# Patient Record
Sex: Female | Born: 1985 | Hispanic: No | Marital: Single | Smoking: Current every day smoker
Health system: Southern US, Community
[De-identification: ages and names within clinical notes are randomized; demographics above are authoritative.]

---
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
## 2011-01-02 ENCOUNTER — Emergency Department (HOSPITAL_BASED_OUTPATIENT_CLINIC_OR_DEPARTMENT_OTHER)
Admission: EM | Admit: 2011-01-02 | Discharge: 2011-01-02 | Disposition: A | Discharge status: Home / Self Care | Payer: No Typology Code available for payment source | Attending: Emergency Medicine | Admitting: Emergency Medicine

## 2011-01-02 DIAGNOSIS — M549 Dorsalgia, unspecified: Secondary | ICD-10-CM | POA: Insufficient documentation

## 2011-01-02 DIAGNOSIS — F17200 Nicotine dependence, unspecified, uncomplicated: Secondary | ICD-10-CM | POA: Insufficient documentation

## 2011-01-02 DIAGNOSIS — Y92410 Unspecified street and highway as the place of occurrence of the external cause: Secondary | ICD-10-CM | POA: Insufficient documentation

## 2013-06-16 ENCOUNTER — Encounter (HOSPITAL_BASED_OUTPATIENT_CLINIC_OR_DEPARTMENT_OTHER): Payer: Self-pay | Admitting: Emergency Medicine

## 2013-06-16 ENCOUNTER — Emergency Department (HOSPITAL_BASED_OUTPATIENT_CLINIC_OR_DEPARTMENT_OTHER)
Admission: EM | Admit: 2013-06-16 | Discharge: 2013-06-17 | Disposition: A | Discharge status: Home / Self Care | Payer: BC Managed Care – PPO | Attending: Emergency Medicine | Admitting: Emergency Medicine

## 2013-06-16 DIAGNOSIS — N390 Urinary tract infection, site not specified: Secondary | ICD-10-CM | POA: Insufficient documentation

## 2013-06-16 DIAGNOSIS — Z3202 Encounter for pregnancy test, result negative: Secondary | ICD-10-CM | POA: Insufficient documentation

## 2013-06-16 DIAGNOSIS — N920 Excessive and frequent menstruation with regular cycle: Secondary | ICD-10-CM | POA: Insufficient documentation

## 2013-06-16 DIAGNOSIS — F17200 Nicotine dependence, unspecified, uncomplicated: Secondary | ICD-10-CM | POA: Insufficient documentation

## 2013-06-16 NOTE — ED Notes (Signed)
Pt c/o lower abd cramping and lower back pain with vaginal bleeding x 2 weeks

## 2013-06-17 LAB — URINE MICROSCOPIC-ADD ON

## 2013-06-17 LAB — URINALYSIS, ROUTINE W REFLEX MICROSCOPIC
Nitrite: NEGATIVE
Protein, ur: NEGATIVE mg/dL
Specific Gravity, Urine: 1.031 — ABNORMAL HIGH (ref 1.005–1.030)
pH: 5.5 (ref 5.0–8.0)

## 2013-06-17 LAB — WET PREP, GENITAL
Trich, Wet Prep: NONE SEEN
Yeast Wet Prep HPF POC: NONE SEEN

## 2013-06-17 LAB — PREGNANCY, URINE: Preg Test, Ur: NEGATIVE

## 2013-06-17 MED ORDER — IBUPROFEN 800 MG PO TABS: 800.0000 mg | ORAL_TABLET | Freq: Three times a day (TID) | ORAL | Status: DC

## 2013-06-17 MED ORDER — NITROFURANTOIN MONOHYD MACRO 100 MG PO CAPS: 100.0000 mg | ORAL_CAPSULE | Freq: Two times a day (BID) | ORAL | Status: DC

## 2013-06-17 NOTE — ED Provider Notes (Signed)
CSN: [id number]     Arrival date & time 06/16/13  2353 History   First MD Initiated Contact with Patient 06/17/13 0015     Chief Complaint  Patient presents with  . Vaginal Bleeding   (Consider location/radiation/quality/duration/timing/severity/associated sxs/prior Treatment) Patient is a 27 y.o. female presenting with vaginal bleeding. The history is provided by the patient.  Vaginal Bleeding Quality:  Lighter than menses and dark red Severity:  Mild Onset quality:  Gradual Duration:  5 days Timing:  Intermittent Progression:  Unchanged Chronicity:  New Menstrual history:  Irregular Possible pregnancy: no   Context: not after intercourse   Relieved by:  Nothing Worsened by:  Nothing tried Ineffective treatments:  None tried Associated symptoms: no fever   Risk factors: no STD     History reviewed. No pertinent past medical history. History reviewed. No pertinent past surgical history. History reviewed. No pertinent family history. History  Substance Use Topics  . Smoking status: Current Every Day Smoker    Types: Cigarettes  . Smokeless tobacco: Not on file  . Alcohol Use: No   OB History   Grav Para Term Preterm Abortions TAB SAB Ect Mult Living                 Review of Systems  Constitutional: Negative for fever.  Genitourinary: Positive for vaginal bleeding.  All other systems reviewed and are negative.    Allergies  Review of patient's allergies indicates no known allergies.  Home Medications  No current outpatient prescriptions on file. BP 137/82  Pulse 94  Temp(Src) 98.5 F (36.9 C) (Oral)  Resp 16  Ht 5\' 4"  (1.626 m)  Wt 270 lb (122.471 kg)  BMI 46.32 kg/m2  SpO2 95%  LMP 06/02/2013 Physical Exam  Constitutional: She is oriented to person, place, and time. She appears well-developed and well-nourished.  HENT:  Head: Normocephalic.  Mouth/Throat: Oropharynx is clear and moist.  Eyes: Conjunctivae are normal. Pupils are equal, round, and  reactive to light.  Neck: Normal range of motion. Neck supple.  Cardiovascular: Normal rate, regular rhythm and intact distal pulses.   Pulmonary/Chest: Effort normal and breath sounds normal. She has no wheezes. She has no rales.  Abdominal: Soft. Bowel sounds are normal. There is no tenderness. There is no rebound and no guarding.  Genitourinary: No vaginal discharge found.  Musculoskeletal: Normal range of motion.  Neurological: She is alert and oriented to person, place, and time.  Skin: Skin is warm and dry.  Psychiatric: She has a normal mood and affect.    ED Course  Procedures (including critical care time) Labs Review Labs Reviewed  WET PREP, GENITAL - Abnormal; Notable for the following:    WBC, Wet Prep HPF POC FEW (*)    All other components within normal limits  URINALYSIS, ROUTINE W REFLEX MICROSCOPIC - Abnormal; Notable for the following:    APPearance CLOUDY (*)    Specific Gravity, Urine 1.031 (*)    Hgb urine dipstick LARGE (*)    Bilirubin Urine SMALL (*)    Ketones, ur 15 (*)    Leukocytes, UA SMALL (*)    All other components within normal limits  URINE MICROSCOPIC-ADD ON - Abnormal; Notable for the following:    Squamous Epithelial / LPF MANY (*)    Bacteria, UA MANY (*)    All other components within normal limits  URINE CULTURE  GC/CHLAMYDIA PROBE AMP  PREGNANCY, URINE   Imaging Review No results found.  EKG Interpretation  None       MDM  No diagnosis found. Suspect anovulatory cycle follow up with GYN for ongoing care.  Will treat for uti and cramping with NSAID    Yardley Beltran K Dequan Kindred-Rasch, MD 06/17/13 [phone]

## 2013-06-18 LAB — URINE CULTURE

## 2013-06-19 LAB — GC/CHLAMYDIA PROBE AMP: CT Probe RNA: NEGATIVE

## 2014-03-11 ENCOUNTER — Encounter (HOSPITAL_COMMUNITY): Payer: Self-pay | Admitting: Emergency Medicine

## 2014-03-11 ENCOUNTER — Emergency Department (HOSPITAL_COMMUNITY)
Admission: EM | Admit: 2014-03-11 | Discharge: 2014-03-11 | Disposition: A | Discharge status: Home / Self Care | Payer: BC Managed Care – PPO | Attending: Emergency Medicine | Admitting: Emergency Medicine

## 2014-03-11 DIAGNOSIS — Y9389 Activity, other specified: Secondary | ICD-10-CM | POA: Diagnosis not present

## 2014-03-11 DIAGNOSIS — Y92410 Unspecified street and highway as the place of occurrence of the external cause: Secondary | ICD-10-CM | POA: Diagnosis not present

## 2014-03-11 DIAGNOSIS — S0993XA Unspecified injury of face, initial encounter: Secondary | ICD-10-CM | POA: Diagnosis not present

## 2014-03-11 DIAGNOSIS — F17200 Nicotine dependence, unspecified, uncomplicated: Secondary | ICD-10-CM | POA: Insufficient documentation

## 2014-03-11 DIAGNOSIS — Z041 Encounter for examination and observation following transport accident: Secondary | ICD-10-CM

## 2014-03-11 DIAGNOSIS — S199XXA Unspecified injury of neck, initial encounter: Principal | ICD-10-CM

## 2014-03-11 MED ORDER — NAPROXEN 500 MG PO TABS: 500.0000 mg | ORAL_TABLET | Freq: Once | ORAL | Status: DC

## 2014-03-11 MED ORDER — CYCLOBENZAPRINE HCL 10 MG PO TABS: 10.0000 mg | ORAL_TABLET | Freq: Two times a day (BID) | ORAL | Status: DC | PRN

## 2014-03-11 MED ORDER — NAPROXEN 500 MG PO TABS: 500.0000 mg | ORAL_TABLET | Freq: Two times a day (BID) | ORAL | Status: DC
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
## 2014-03-11 NOTE — ED Notes (Signed)
Patient states that she is having upper back pain with Headache and neck pain. The patient has c-collar intact.

## 2014-03-11 NOTE — ED Notes (Signed)
Pt left prior to receiving d/c instructions.
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
## 2014-03-12 NOTE — ED Provider Notes (Signed)
Medical screening examination/treatment/procedure(s) were performed by non-physician practitioner and as supervising physician I was immediately available for consultation/collaboration.   EKG Interpretation None       Jasper Riling. Alvino Chapel, MD 03/12/14 [phone]

## 2014-10-26 ENCOUNTER — Emergency Department (HOSPITAL_BASED_OUTPATIENT_CLINIC_OR_DEPARTMENT_OTHER)
Admission: EM | Admit: 2014-10-26 | Discharge: 2014-10-27 | Disposition: A | Discharge status: Home / Self Care | Payer: BLUE CROSS/BLUE SHIELD | Attending: Emergency Medicine | Admitting: Emergency Medicine

## 2014-10-26 ENCOUNTER — Encounter (HOSPITAL_BASED_OUTPATIENT_CLINIC_OR_DEPARTMENT_OTHER): Payer: Self-pay

## 2014-10-26 DIAGNOSIS — Z792 Long term (current) use of antibiotics: Secondary | ICD-10-CM | POA: Insufficient documentation

## 2014-10-26 DIAGNOSIS — K029 Dental caries, unspecified: Secondary | ICD-10-CM

## 2014-10-26 DIAGNOSIS — Z720 Tobacco use: Secondary | ICD-10-CM | POA: Insufficient documentation

## 2014-10-26 DIAGNOSIS — Z791 Long term (current) use of non-steroidal anti-inflammatories (NSAID): Secondary | ICD-10-CM | POA: Insufficient documentation

## 2014-10-26 NOTE — ED Notes (Signed)
MD at bedside. 

## 2014-10-26 NOTE — ED Notes (Signed)
Right upper toothache x today

## 2014-10-27 ENCOUNTER — Encounter (HOSPITAL_BASED_OUTPATIENT_CLINIC_OR_DEPARTMENT_OTHER): Payer: Self-pay | Admitting: Emergency Medicine

## 2014-10-27 MED ORDER — MELOXICAM 15 MG PO TABS
15.0000 mg | ORAL_TABLET | Freq: Every day | ORAL | Status: DC
Start: 2014-10-27 — End: 2015-01-31

## 2014-10-27 MED ORDER — PENICILLIN V POTASSIUM 250 MG PO TABS
500.0000 mg | ORAL_TABLET | Freq: Once | ORAL | Status: AC
  Administered 2014-10-27: 500 mg via ORAL

## 2014-10-27 MED ORDER — IBUPROFEN 800 MG PO TABS
ORAL_TABLET | ORAL | Status: AC
  Filled 2014-10-27: qty 1

## 2014-10-27 MED ORDER — PENICILLIN V POTASSIUM 250 MG PO TABS
ORAL_TABLET | ORAL | Status: AC
  Filled 2014-10-27: qty 2

## 2014-10-27 MED ORDER — IBUPROFEN 800 MG PO TABS
800.0000 mg | ORAL_TABLET | Freq: Once | ORAL | Status: AC
  Administered 2014-10-27: 800 mg via ORAL

## 2014-10-27 MED ORDER — PENICILLIN V POTASSIUM 500 MG PO TABS: 500.0000 mg | ORAL_TABLET | Freq: Four times a day (QID) | ORAL | Status: AC

## 2014-10-27 NOTE — ED Provider Notes (Signed)
CSN: [id number]     Arrival date & time 10/26/14  2222 History   First MD Initiated Contact with Patient 10/26/14 2343     Chief Complaint  Patient presents with  . Dental Pain     (Consider location/radiation/quality/duration/timing/severity/associated sxs/prior Treatment) Patient is a 29 y.o. female presenting with tooth pain. The history is provided by the patient.  Dental Pain Location:  Upper Upper teeth location:  2/RU 2nd molar Quality:  Dull Severity:  Severe Onset quality:  Sudden Timing:  Constant Progression:  Unchanged Chronicity:  New Context: dental caries   Previous work-up:  Dental exam Relieved by:  Nothing Worsened by:  Nothing tried Ineffective treatments:  None tried Associated symptoms: no congestion, no neck swelling and no trismus   Risk factors: smoking     History reviewed. No pertinent past medical history. History reviewed. No pertinent past surgical history. History reviewed. No pertinent family history. History  Substance Use Topics  . Smoking status: Current Every Day Smoker    Types: Cigarettes  . Smokeless tobacco: Not on file  . Alcohol Use: No   OB History    No data available     Review of Systems  HENT: Negative for congestion.   All other systems reviewed and are negative.     Allergies  Review of patient's allergies indicates no known allergies.  Home Medications   Prior to Admission medications   Medication Sig Start Date End Date Taking? Authorizing Provider  meloxicam (MOBIC) 15 MG tablet Take 1 tablet (15 mg total) by mouth daily. 10/27/14   Carin Shipp, MD  penicillin v potassium (VEETID) 500 MG tablet Take 1 tablet (500 mg total) by mouth 4 (four) times daily. 10/27/14 11/03/14  Krystyl Cannell, MD   BP 147/87 mmHg  Pulse 85  Temp(Src) 98.7 F (37.1 C) (Oral)  Resp 16  Ht 5\' 4"  (1.626 m)  Wt 260 lb (117.935 kg)  BMI 44.61 kg/m2  SpO2 100%  LMP 10/05/2014 Physical Exam  Constitutional: She is oriented to  person, place, and time. She appears well-developed and well-nourished. No distress.  HENT:  Head: Normocephalic and atraumatic.  Mouth/Throat: Oropharynx is clear and moist. No trismus in the jaw.    Eyes: Conjunctivae are normal. Pupils are equal, round, and reactive to light.  Neck: Normal range of motion. Neck supple.  Cardiovascular: Normal rate and regular rhythm.   Pulmonary/Chest: Effort normal. No respiratory distress. She has no wheezes. She has no rales.  Abdominal: Soft. Bowel sounds are normal. There is no tenderness. There is no rebound and no guarding.  Musculoskeletal: Normal range of motion.  Lymphadenopathy:    She has no cervical adenopathy.  Neurological: She is alert and oriented to person, place, and time.  Skin: Skin is warm and dry.  Psychiatric: She has a normal mood and affect.    ED Course  Procedures (including critical care time) Labs Review Labs Reviewed - No data to display  Imaging Review No results found.   EKG Interpretation None      MDM   Final diagnoses:  Dental caries    Penicillin, NSAIDs and close follow up with dentistry    Aleicia Kenagy, MD 10/27/14 [phone]

## 2014-10-27 NOTE — Discharge Instructions (Signed)
Dental Care and Dentist Visits  Dental care supports good overall health. Regular dental visits can also help you avoid dental pain, bleeding, infection, and other more serious health problems in the future. It is important to keep the mouth healthy because diseases in the teeth, gums, and other oral tissues can spread to other areas of the body. Some problems, such as diabetes, heart disease, and pre-term labor have been associated with poor oral health.   See your dentist every 6 months. If you experience emergency problems such as a toothache or broken tooth, go to the dentist right away. If you see your dentist regularly, you may catch problems early. It is easier to be treated for problems in the early stages.   WHAT TO EXPECT AT A DENTIST VISIT   Your dentist will look for many common oral health problems and recommend proper treatment. At your regular dental visit, you can expect:  · Gentle cleaning of the teeth and gums. This includes scraping and polishing. This helps to remove the sticky substance around the teeth and gums (plaque). Plaque forms in the mouth shortly after eating. Over time, plaque hardens on the teeth as tartar. If tartar is not removed regularly, it can cause problems. Cleaning also helps remove stains.  · Periodic X-rays. These pictures of the teeth and supporting bone will help your dentist assess the health of your teeth.  · Periodic fluoride treatments. Fluoride is a natural mineral shown to help strengthen teeth. Fluoride treatment involves applying a fluoride gel or varnish to the teeth. It is most commonly done in children.  · Examination of the mouth, tongue, jaws, teeth, and gums to look for any oral health problems, such as:  ¨ Cavities (dental caries). This is decay on the tooth caused by plaque, sugar, and acid in the mouth. It is best to catch a cavity when it is small.  ¨ Inflammation of the gums caused by plaque buildup (gingivitis).  ¨ Problems with the mouth or malformed  or misaligned teeth.  ¨ Oral cancer or other diseases of the soft tissues or jaws.   KEEP YOUR TEETH AND GUMS HEALTHY  For healthy teeth and gums, follow these general guidelines as well as your dentist's specific advice:  · Have your teeth professionally cleaned at the dentist every 6 months.  · Brush twice daily with a fluoride toothpaste.  · Floss your teeth daily.   · Ask your dentist if you need fluoride supplements, treatments, or fluoride toothpaste.  · Eat a healthy diet. Reduce foods and drinks with added sugar.  · Avoid smoking.  TREATMENT FOR ORAL HEALTH PROBLEMS  If you have oral health problems, treatment varies depending on the conditions present in your teeth and gums.  · Your caregiver will most likely recommend good oral hygiene at each visit.  · For cavities, gingivitis, or other oral health disease, your caregiver will perform a procedure to treat the problem. This is typically done at a separate appointment. Sometimes your caregiver will refer you to another dental specialist for specific tooth problems or for surgery.  SEEK IMMEDIATE DENTAL CARE IF:  · You have pain, bleeding, or soreness in the gum, tooth, jaw, or mouth area.  · A permanent tooth becomes loose or separated from the gum socket.  · You experience a blow or injury to the mouth or jaw area.  Document Released: 03/20/2011 Document Revised: 09/30/2011 Document Reviewed: 03/20/2011  ExitCare® Patient Information ©2015 [employer]. This information is not intended to replace advice   given to you by your health care provider. Make sure you discuss any questions you have with your health care provider.

## 2014-11-12 DIAGNOSIS — Z791 Long term (current) use of non-steroidal anti-inflammatories (NSAID): Secondary | ICD-10-CM | POA: Insufficient documentation

## 2014-11-12 DIAGNOSIS — Z720 Tobacco use: Secondary | ICD-10-CM | POA: Insufficient documentation

## 2014-11-12 DIAGNOSIS — N390 Urinary tract infection, site not specified: Secondary | ICD-10-CM | POA: Insufficient documentation

## 2014-11-13 ENCOUNTER — Encounter (HOSPITAL_BASED_OUTPATIENT_CLINIC_OR_DEPARTMENT_OTHER): Payer: Self-pay | Admitting: *Deleted

## 2014-11-13 ENCOUNTER — Emergency Department (HOSPITAL_BASED_OUTPATIENT_CLINIC_OR_DEPARTMENT_OTHER)
Admission: EM | Admit: 2014-11-13 | Discharge: 2014-11-13 | Disposition: A | Discharge status: Home / Self Care | Payer: BLUE CROSS/BLUE SHIELD | Attending: Emergency Medicine | Admitting: Emergency Medicine

## 2014-11-13 DIAGNOSIS — N390 Urinary tract infection, site not specified: Secondary | ICD-10-CM

## 2014-11-13 LAB — URINE MICROSCOPIC-ADD ON

## 2014-11-13 LAB — URINALYSIS, ROUTINE W REFLEX MICROSCOPIC
Bilirubin Urine: NEGATIVE
GLUCOSE, UA: NEGATIVE mg/dL
Ketones, ur: NEGATIVE mg/dL
LEUKOCYTES UA: NEGATIVE
Nitrite: POSITIVE — AB
Protein, ur: NEGATIVE mg/dL
SPECIFIC GRAVITY, URINE: 1.012 (ref 1.005–1.030)
Urobilinogen, UA: 1 mg/dL (ref 0.0–1.0)
pH: 6 (ref 5.0–8.0)

## 2014-11-13 MED ORDER — NITROFURANTOIN MONOHYD MACRO 100 MG PO CAPS: 100.0000 mg | ORAL_CAPSULE | Freq: Two times a day (BID) | ORAL | Status: DC

## 2014-11-13 MED ORDER — FLUCONAZOLE 150 MG PO TABS: 150.0000 mg | ORAL_TABLET | Freq: Once | ORAL | Status: DC

## 2014-11-13 MED ORDER — NITROFURANTOIN MONOHYD MACRO 100 MG PO CAPS
100.0000 mg | ORAL_CAPSULE | Freq: Once | ORAL | Status: AC
  Administered 2014-11-13: 100 mg via ORAL
  Filled 2014-11-13: qty 1

## 2014-11-13 NOTE — ED Notes (Signed)
Pt here for pain and burning with urination and today she saw that there was blood in her urine.  Pt denies any GYN symptoms with this.

## 2014-11-13 NOTE — Discharge Instructions (Signed)

## 2014-11-13 NOTE — ED Provider Notes (Signed)
CSN: [id number]     Arrival date & time 11/12/14  2352 History   First MD Initiated Contact with Patient 11/13/14 0146     Chief Complaint  Patient presents with  . Urinary Tract Infection     (Consider location/radiation/quality/duration/timing/severity/associated sxs/prior Treatment) HPI Patient presents with several days of burning urination, hematuria and suprapubic pain. Patient states she's had similar symptoms in the past related to urinary tract infection. She denies any vaginal bleeding or discharge. She recently finished a course of penicillin for dental pain has been taking over-the-counter AZO. She denies fever or chills. She has had no nausea or vomiting. No flank pain.   History reviewed. No pertinent past medical history. History reviewed. No pertinent past surgical history. No family history on file. History  Substance Use Topics  . Smoking status: Current Every Day Smoker    Types: Cigarettes  . Smokeless tobacco: Not on file  . Alcohol Use: No   OB History    No data available     Review of Systems  Constitutional: Negative for fever and chills.  Gastrointestinal: Positive for abdominal pain. Negative for nausea and vomiting.  Genitourinary: Positive for dysuria and hematuria. Negative for frequency, flank pain, vaginal bleeding, vaginal discharge, difficulty urinating, vaginal pain and pelvic pain.  Skin: Negative for rash and wound.  Neurological: Negative for weakness, light-headedness, numbness and headaches.  All other systems reviewed and are negative.     Allergies  Review of patient's allergies indicates no known allergies.  Home Medications   Prior to Admission medications   Medication Sig Start Date End Date Taking? Authorizing Provider  fluconazole (DIFLUCAN) 150 MG tablet Take 1 tablet (150 mg total) by mouth once. 11/13/14   Julianne Rice, MD  meloxicam (MOBIC) 15 MG tablet Take 1 tablet (15 mg total) by mouth daily. 10/27/14   April  Palumbo, MD  nitrofurantoin, macrocrystal-monohydrate, (MACROBID) 100 MG capsule Take 1 capsule (100 mg total) by mouth 2 (two) times daily. 11/13/14   Julianne Rice, MD   BP 145/84 mmHg  Pulse 72  Temp(Src) 98.1 F (36.7 C) (Oral)  Resp 18  Wt 260 lb (117.935 kg)  SpO2 99%  LMP 11/06/2014 Physical Exam  Constitutional: She is oriented to person, place, and time. She appears well-developed and well-nourished. No distress.  HENT:  Head: Normocephalic and atraumatic.  Mouth/Throat: Oropharynx is clear and moist.  Eyes: EOM are normal. Pupils are equal, round, and reactive to light.  Neck: Normal range of motion. Neck supple.  Cardiovascular: Normal rate and regular rhythm.   Pulmonary/Chest: Effort normal and breath sounds normal. No respiratory distress. She has no wheezes. She has no rales.  Abdominal: Soft. Bowel sounds are normal. She exhibits no distension and no mass. There is no tenderness. There is no rebound and no guarding.  Musculoskeletal: Normal range of motion. She exhibits no edema or tenderness.  No CVA tenderness bilaterally.  Neurological: She is alert and oriented to person, place, and time.  Skin: Skin is warm and dry. No rash noted. No erythema.  Psychiatric: She has a normal mood and affect. Her behavior is normal.  Nursing note and vitals reviewed.   ED Course  Procedures (including critical care time) Labs Review Labs Reviewed  URINALYSIS, ROUTINE W REFLEX MICROSCOPIC - Abnormal; Notable for the following:    Color, Urine ORANGE (*)    APPearance CLOUDY (*)    Hgb urine dipstick MODERATE (*)    Nitrite POSITIVE (*)    All other  components within normal limits  URINE MICROSCOPIC-ADD ON - Abnormal; Notable for the following:    Bacteria, UA FEW (*)    All other components within normal limits    Imaging Review No results found.   EKG Interpretation None      MDM   Final diagnoses:  UTI (lower urinary tract infection)    Patient with  nitrite positive urine. Only a few bacteria seen. Questionable partially treated UTI. No red blood cells seen. Discoloration of urine may be due to medication.  Short course of antibiotics. Return precautions have been given.    Julianne Rice, MD 11/13/14 [phone]

## 2014-12-21 ENCOUNTER — Emergency Department (HOSPITAL_BASED_OUTPATIENT_CLINIC_OR_DEPARTMENT_OTHER)
Admission: EM | Admit: 2014-12-21 | Discharge: 2014-12-21 | Disposition: A | Discharge status: Home / Self Care | Payer: BLUE CROSS/BLUE SHIELD | Attending: Emergency Medicine | Admitting: Emergency Medicine

## 2014-12-21 ENCOUNTER — Encounter (HOSPITAL_BASED_OUTPATIENT_CLINIC_OR_DEPARTMENT_OTHER): Payer: Self-pay | Admitting: Emergency Medicine

## 2014-12-21 ENCOUNTER — Emergency Department (HOSPITAL_BASED_OUTPATIENT_CLINIC_OR_DEPARTMENT_OTHER): Payer: BLUE CROSS/BLUE SHIELD

## 2014-12-21 ENCOUNTER — Emergency Department (HOSPITAL_BASED_OUTPATIENT_CLINIC_OR_DEPARTMENT_OTHER): Payer: No Typology Code available for payment source

## 2014-12-21 DIAGNOSIS — S80211A Abrasion, right knee, initial encounter: Secondary | ICD-10-CM | POA: Insufficient documentation

## 2014-12-21 DIAGNOSIS — M25561 Pain in right knee: Secondary | ICD-10-CM

## 2014-12-21 DIAGNOSIS — S90511A Abrasion, right ankle, initial encounter: Secondary | ICD-10-CM | POA: Insufficient documentation

## 2014-12-21 DIAGNOSIS — Z720 Tobacco use: Secondary | ICD-10-CM | POA: Insufficient documentation

## 2014-12-21 DIAGNOSIS — M25572 Pain in left ankle and joints of left foot: Secondary | ICD-10-CM

## 2014-12-21 DIAGNOSIS — M25571 Pain in right ankle and joints of right foot: Secondary | ICD-10-CM

## 2014-12-21 DIAGNOSIS — Y998 Other external cause status: Secondary | ICD-10-CM | POA: Insufficient documentation

## 2014-12-21 DIAGNOSIS — T148XXA Other injury of unspecified body region, initial encounter: Secondary | ICD-10-CM

## 2014-12-21 DIAGNOSIS — Z79899 Other long term (current) drug therapy: Secondary | ICD-10-CM | POA: Insufficient documentation

## 2014-12-21 DIAGNOSIS — S90512A Abrasion, left ankle, initial encounter: Secondary | ICD-10-CM | POA: Insufficient documentation

## 2014-12-21 DIAGNOSIS — Z23 Encounter for immunization: Secondary | ICD-10-CM | POA: Insufficient documentation

## 2014-12-21 DIAGNOSIS — Z791 Long term (current) use of non-steroidal anti-inflammatories (NSAID): Secondary | ICD-10-CM | POA: Insufficient documentation

## 2014-12-21 DIAGNOSIS — M25562 Pain in left knee: Secondary | ICD-10-CM

## 2014-12-21 DIAGNOSIS — S80212A Abrasion, left knee, initial encounter: Secondary | ICD-10-CM | POA: Insufficient documentation

## 2014-12-21 DIAGNOSIS — Y92410 Unspecified street and highway as the place of occurrence of the external cause: Secondary | ICD-10-CM | POA: Insufficient documentation

## 2014-12-21 DIAGNOSIS — M25579 Pain in unspecified ankle and joints of unspecified foot: Secondary | ICD-10-CM

## 2014-12-21 DIAGNOSIS — Y9389 Activity, other specified: Secondary | ICD-10-CM | POA: Insufficient documentation

## 2014-12-21 MED ORDER — HYDROCODONE-ACETAMINOPHEN 5-325 MG PO TABS: 1.0000 | ORAL_TABLET | ORAL | Status: DC | PRN

## 2014-12-21 MED ORDER — CYCLOBENZAPRINE HCL 5 MG PO TABS: 5.0000 mg | ORAL_TABLET | Freq: Two times a day (BID) | ORAL | Status: DC | PRN

## 2014-12-21 MED ORDER — TETANUS-DIPHTH-ACELL PERTUSSIS 5-2.5-18.5 LF-MCG/0.5 IM SUSP
0.5000 mL | Freq: Once | INTRAMUSCULAR | Status: AC
  Administered 2014-12-21: 0.5 mL via INTRAMUSCULAR
  Filled 2014-12-21: qty 0.5

## 2014-12-21 NOTE — ED Notes (Signed)
Warm blanket given to pt
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
## 2014-12-21 NOTE — Discharge Instructions (Signed)
Abrasion °An abrasion is a cut or scrape of the skin. Abrasions do not extend through all layers of the skin and most heal within 10 days. It is important to care for your abrasion properly to prevent infection. °CAUSES  °Most abrasions are caused by falling on, or gliding across, the ground or other surface. When your skin rubs on something, the outer and inner layer of skin rubs off, causing an abrasion. °DIAGNOSIS  °Your caregiver will be able to diagnose an abrasion during a physical exam.  °TREATMENT  °Your treatment depends on how large and deep the abrasion is. Generally, your abrasion will be cleaned with water and a mild soap to remove any dirt or debris. An antibiotic ointment may be put over the abrasion to prevent an infection. A bandage (dressing) may be wrapped around the abrasion to keep it from getting dirty.  °You may need a tetanus shot if: °· You cannot remember when you had your last tetanus shot. °· You have never had a tetanus shot. °· The injury broke your skin. °If you get a tetanus shot, your arm may swell, get red, and feel warm to the touch. This is common and not a problem. If you need a tetanus shot and you choose not to have one, there is a rare chance of getting tetanus. Sickness from tetanus can be serious.  °HOME CARE INSTRUCTIONS  °· If a dressing was applied, change it at least once a day or as directed by your caregiver. If the bandage sticks, soak it off with warm water.   °· Wash the area with water and a mild soap to remove all the ointment 2 times a day. Rinse off the soap and pat the area dry with a clean towel.   °· Reapply any ointment as directed by your caregiver. This will help prevent infection and keep the bandage from sticking. Use gauze over the wound and under the dressing to help keep the bandage from sticking.   °· Change your dressing right away if it becomes wet or dirty.   °· Only take over-the-counter or prescription medicines for pain, discomfort, or fever as  directed by your caregiver.   °· Follow up with your caregiver within 24-48 hours for a wound check, or as directed. If you were not given a wound-check appointment, look closely at your abrasion for redness, swelling, or pus. These are signs of infection. °SEEK IMMEDIATE MEDICAL CARE IF:  °· You have increasing pain in the wound.   °· You have redness, swelling, or tenderness around the wound.   °· You have pus coming from the wound.   °· You have a fever or persistent symptoms for more than 2-3 days. °· You have a fever and your symptoms suddenly get worse. °· You have a bad smell coming from the wound or dressing.   °MAKE SURE YOU:  °· Understand these instructions. °· Will watch your condition. °· Will get help right away if you are not doing well or get worse. °Document Released: 04/17/2005 Document Revised: 06/24/2012 Document Reviewed: 06/11/2011 °[employer]® Patient Information ©2015 [employer]. This information is not intended to replace advice given to you by your health care provider. Make sure you discuss any questions you have with your health care provider. °Motor Vehicle Collision °It is common to have multiple bruises and sore muscles after a motor vehicle collision (MVC). These tend to feel worse for the first 24 hours. You may have the most stiffness and soreness over the first several hours. You may also feel   worse when you wake up the first morning after your collision. After this point, you will usually begin to improve with each day. The speed of improvement often depends on the severity of the collision, the number of injuries, and the location and nature of these injuries. °HOME CARE INSTRUCTIONS °· Put ice on the injured area. °¨ Put ice in a plastic bag. °¨ Place a towel between your skin and the bag. °¨ Leave the ice on for 15-20 minutes, 3-4 times a day, or as directed by your health care provider. °· Drink enough fluids to keep your urine clear or pale yellow. Do not drink  alcohol. °· Take a warm shower or bath once or twice a day. This will increase blood flow to sore muscles. °· You may return to activities as directed by your caregiver. Be careful when lifting, as this may aggravate neck or back pain. °· Only take over-the-counter or prescription medicines for pain, discomfort, or fever as directed by your caregiver. Do not use aspirin. This may increase bruising and bleeding. °SEEK IMMEDIATE MEDICAL CARE IF: °· You have numbness, tingling, or weakness in the arms or legs. °· You develop severe headaches not relieved with medicine. °· You have severe neck pain, especially tenderness in the middle of the back of your neck. °· You have changes in bowel or bladder control. °· There is increasing pain in any area of the body. °· You have shortness of breath, light-headedness, dizziness, or fainting. °· You have chest pain. °· You feel sick to your stomach (nauseous), throw up (vomit), or sweat. °· You have increasing abdominal discomfort. °· There is blood in your urine, stool, or vomit. °· You have pain in your shoulder (shoulder strap areas). °· You feel your symptoms are getting worse. °MAKE SURE YOU: °· Understand these instructions. °· Will watch your condition. °· Will get help right away if you are not doing well or get worse. °Document Released: 07/08/2005 Document Revised: 11/22/2013 Document Reviewed: 12/05/2010 °Exit[employer]® Patient Information ©2015 [employer]. This information is not intended to replace advice given to you by your health care provider. Make sure you discuss any questions you have with your health care provider. ° °

## 2015-01-18 ENCOUNTER — Encounter (HOSPITAL_BASED_OUTPATIENT_CLINIC_OR_DEPARTMENT_OTHER): Payer: Self-pay | Admitting: *Deleted

## 2015-01-18 ENCOUNTER — Emergency Department (HOSPITAL_BASED_OUTPATIENT_CLINIC_OR_DEPARTMENT_OTHER)
Admission: EM | Admit: 2015-01-18 | Discharge: 2015-01-18 | Disposition: A | Discharge status: Home / Self Care | Payer: Self-pay | Attending: Emergency Medicine | Admitting: Emergency Medicine

## 2015-01-18 ENCOUNTER — Emergency Department (HOSPITAL_BASED_OUTPATIENT_CLINIC_OR_DEPARTMENT_OTHER): Payer: Self-pay

## 2015-01-18 DIAGNOSIS — W503XXA Accidental bite by another person, initial encounter: Secondary | ICD-10-CM

## 2015-01-18 DIAGNOSIS — S21212A Laceration without foreign body of left back wall of thorax without penetration into thoracic cavity, initial encounter: Secondary | ICD-10-CM | POA: Insufficient documentation

## 2015-01-18 DIAGNOSIS — Z791 Long term (current) use of non-steroidal anti-inflammatories (NSAID): Secondary | ICD-10-CM | POA: Insufficient documentation

## 2015-01-18 DIAGNOSIS — S21112A Laceration without foreign body of left front wall of thorax without penetration into thoracic cavity, initial encounter: Secondary | ICD-10-CM | POA: Insufficient documentation

## 2015-01-18 DIAGNOSIS — S41012A Laceration without foreign body of left shoulder, initial encounter: Secondary | ICD-10-CM | POA: Insufficient documentation

## 2015-01-18 DIAGNOSIS — Z720 Tobacco use: Secondary | ICD-10-CM | POA: Insufficient documentation

## 2015-01-18 DIAGNOSIS — Y998 Other external cause status: Secondary | ICD-10-CM | POA: Insufficient documentation

## 2015-01-18 DIAGNOSIS — Y9289 Other specified places as the place of occurrence of the external cause: Secondary | ICD-10-CM | POA: Insufficient documentation

## 2015-01-18 DIAGNOSIS — Z79899 Other long term (current) drug therapy: Secondary | ICD-10-CM | POA: Insufficient documentation

## 2015-01-18 DIAGNOSIS — Y9389 Activity, other specified: Secondary | ICD-10-CM | POA: Insufficient documentation

## 2015-01-18 MED ORDER — AMOXICILLIN-POT CLAVULANATE 875-125 MG PO TABS: 1.0000 | ORAL_TABLET | Freq: Two times a day (BID) | ORAL | Status: DC

## 2015-01-18 MED ORDER — AMOXICILLIN-POT CLAVULANATE 875-125 MG PO TABS
1.0000 | ORAL_TABLET | Freq: Once | ORAL | Status: AC
  Administered 2015-01-18: 1 via ORAL
  Filled 2015-01-18: qty 1

## 2015-01-18 MED ORDER — FLUCONAZOLE 150 MG PO TABS: 150.0000 mg | ORAL_TABLET | Freq: Once | ORAL | Status: DC

## 2015-01-18 MED ORDER — LIDOCAINE-EPINEPHRINE 2 %-1:100000 IJ SOLN
20.0000 mL | Freq: Once | INTRAMUSCULAR | Status: AC
  Administered 2015-01-18: 20 mL via INTRADERMAL
  Filled 2015-01-18: qty 1

## 2015-01-18 MED ORDER — IBUPROFEN 800 MG PO TABS: 800.0000 mg | ORAL_TABLET | Freq: Three times a day (TID) | ORAL | Status: DC | PRN
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
## 2015-01-31 ENCOUNTER — Encounter (HOSPITAL_BASED_OUTPATIENT_CLINIC_OR_DEPARTMENT_OTHER): Payer: Self-pay

## 2015-01-31 ENCOUNTER — Emergency Department (HOSPITAL_BASED_OUTPATIENT_CLINIC_OR_DEPARTMENT_OTHER)
Admission: EM | Admit: 2015-01-31 | Discharge: 2015-01-31 | Disposition: A | Discharge status: Home / Self Care | Payer: Self-pay | Attending: Emergency Medicine | Admitting: Emergency Medicine

## 2015-01-31 DIAGNOSIS — Z4802 Encounter for removal of sutures: Secondary | ICD-10-CM | POA: Insufficient documentation

## 2015-01-31 DIAGNOSIS — Z720 Tobacco use: Secondary | ICD-10-CM | POA: Insufficient documentation

## 2015-01-31 NOTE — Discharge Instructions (Signed)

## 2015-01-31 NOTE — ED Provider Notes (Signed)
CSN: [id number]     Arrival date & time 01/31/15  1503 History   First MD Initiated Contact with Patient 01/31/15 1534     Chief Complaint  Patient presents with  . Follow-up     (Consider location/radiation/quality/duration/timing/severity/associated sxs/prior Treatment) HPI Karen Murray is a 29 y.o. female patient presents today for suture removal. Denies any fevers, chills, nausea or vomiting, abdominal pain, chest pain, shortness of breath. She does report intermittent left sided arm pain, but does not want any new medications or treatment for this. She states "I'll be fine with ibuprofen at home". She does report intermittent numbness in her left chest. She denies any decreased range of motion, cool extremities. No other modifying factors. No other medical complaints at this time.  History reviewed. No pertinent past medical history. History reviewed. No pertinent past surgical history. No family history on file. History  Substance Use Topics  . Smoking status: Current Every Day Smoker    Types: Cigarettes  . Smokeless tobacco: Not on file  . Alcohol Use: Yes   OB History    No data available     Review of Systems A 10 point review of systems was completed and was negative except for pertinent positives and negatives as mentioned in the history of present illness     Allergies  Review of patient's allergies indicates no known allergies.  Home Medications   Prior to Admission medications   Not on File   BP 145/87 mmHg  Pulse 90  Temp(Src) 98.2 F (36.8 C) (Oral)  Resp 18  Ht 5\' 4"  (1.626 m)  Wt 250 lb (113.399 kg)  BMI 42.89 kg/m2  SpO2 100%  LMP 01/18/2015 Physical Exam  Constitutional:  Awake, alert, nontoxic appearance.  HENT:  Head: Atraumatic.  Eyes: Right eye exhibits no discharge. Left eye exhibits no discharge.  Neck: Neck supple.  Pulmonary/Chest: Effort normal. She exhibits no tenderness.  Abdominal: Soft. There is no tenderness. There is no  rebound.  Musculoskeletal: She exhibits no tenderness.  Baseline ROM, no obvious new focal weakness.  Neurological:  Mental status and motor strength appears baseline for patient and situation. Sensation intact to light touch throughout chest and extremities.. No focal neurodeficits.  Skin: No rash noted.  Sutures to left lateral neck and left posterior shoulder healing well. No evidence of cellulitis or other infection.  Psychiatric: She has a normal mood and affect.  Nursing note and vitals reviewed.   ED Course  SUTURE REMOVAL Date/Time: 01/31/2015 4:19 PM Performed by: Comer Locket Authorized by: Comer Locket Consent: Verbal consent obtained. Risks and benefits: risks, benefits and alternatives were discussed Consent given by: patient Patient understanding: patient states understanding of the procedure being performed Patient identity confirmed: verbally with patient Body area: head/neck Location details: neck Wound Appearance: clean Sutures Removed: 3 Patient tolerance: Patient tolerated the procedure well with no immediate complications   SUTURE REMOVAL Date/Time: 01/31/2015 4:20 PM Performed by: Comer Locket Authorized by: Comer Locket Consent: Verbal consent obtained. Risks and benefits: risks, benefits and alternatives were discussed Consent given by: patient Patient understanding: patient states understanding of the procedure being performed Patient identity confirmed: verbally with patient Body area: Left shoulder Location details: Left shoulder  Wound Appearance: clean Sutures Removed: 3 Patient tolerance: Patient tolerated the procedure well with no immediate complications    (including critical care time)  Labs Review Labs Reviewed - No data to display  Imaging Review No results found.   EKG Interpretation None  Meds given in ED:  Medications - No data to display  There are no discharge medications for this  patient.  Filed Vitals:   01/31/15 1510 01/31/15 1638  BP: 115/57 145/87  Pulse: 111 90  Temp: 98.2 F (36.8 C)   TempSrc: Oral   Resp: 18 18  Height: 5\' 4"  (1.626 m)   Weight: 250 lb (113.399 kg)   SpO2: 100% 100%    MDM  Vitals stable - WNL -afebrile Pt resting comfortably in ED. PE--physical exam is not concerning. Normal neuro exam. Moves all 4 extremities with normal range of motion and without discomfort. Ambulates without difficulty. DDX--total of 6 sutures removed in the ED. No evidence of cellulitis or infection. Encourage continued use of ibuprofen for discomfort at home. Follow-up with primary care. No evidence of other acute or emergent pathology. I discussed all relevant lab findings and imaging results with pt and they verbalized understanding. Discussed f/u with PCP within 48 hrs and return precautions, pt very amenable to plan.  Final diagnoses:  Visit for suture removal        Comer Locket, PA-C 01/31/15 1734  Veryl Speak, MD 01/31/15 1754
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
## 2015-01-31 NOTE — ED Notes (Addendum)
Report received from [place], [place] care assumed.
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
## 2020-07-16 ENCOUNTER — Other Ambulatory Visit: Payer: Self-pay

## 2020-07-16 ENCOUNTER — Encounter (HOSPITAL_BASED_OUTPATIENT_CLINIC_OR_DEPARTMENT_OTHER): Payer: Self-pay

## 2020-07-16 ENCOUNTER — Emergency Department (HOSPITAL_BASED_OUTPATIENT_CLINIC_OR_DEPARTMENT_OTHER)
Admission: EM | Admit: 2020-07-16 | Discharge: 2020-07-16 | Disposition: A | Discharge status: Home / Self Care | Payer: Managed Care, Other (non HMO) | Attending: Emergency Medicine | Admitting: Emergency Medicine

## 2020-07-16 DIAGNOSIS — U071 COVID-19: Secondary | ICD-10-CM | POA: Insufficient documentation

## 2020-07-16 DIAGNOSIS — R69 Illness, unspecified: Secondary | ICD-10-CM | POA: Diagnosis present

## 2020-07-16 DIAGNOSIS — F17210 Nicotine dependence, cigarettes, uncomplicated: Secondary | ICD-10-CM | POA: Insufficient documentation

## 2020-07-16 LAB — RESP PANEL BY RT-PCR (FLU A&B, COVID) ARPGX2
Influenza A by PCR: NEGATIVE
Influenza B by PCR: NEGATIVE
SARS Coronavirus 2 by RT PCR: POSITIVE — AB

## 2020-07-16 MED ORDER — BENZONATATE 100 MG PO CAPS: 100.0000 mg | ORAL_CAPSULE | Freq: Three times a day (TID) | ORAL | 0 refills | Status: AC

## 2020-07-16 MED ORDER — ONDANSETRON 4 MG PO TBDP: ORAL_TABLET | ORAL | 0 refills | Status: AC

## 2020-07-16 NOTE — Discharge Instructions (Signed)
Your Covid test today was positive.  Please contact your work and let them know.  Each workplace has a different protocol and they may have different timing of when they like you to return to work.  If you develop symptoms this may delay when you are able to return.  Take tylenol 2 pills 4 times a day and motrin 4 pills 3 times a day.  Drink plenty of fluids.  Return for worsening shortness of breath, headache, confusion. Follow up with your family doctor.

## 2020-07-16 NOTE — ED Provider Notes (Signed)
[facility] EMERGENCY DEPARTMENT Provider Note   CSN: [id number] Arrival date & time: 07/16/20  1451     History Chief Complaint  Patient presents with  . Exposure to [name] is a 34 y.o. female.  34 yo F with a cc of being exposed to someone who is suspected to have the novel coronavirus.  Patient denies any symptoms.  Has been in close contact with this person for the past 48 hours.  She denies cough or congestion denies nausea vomiting or diarrhea.  Denies abdominal pain.  The history is provided by the patient.  Illness Severity:  Moderate Onset quality:  Gradual Duration:  1 day Timing:  Constant Progression:  Unchanged Chronicity:  New Associated symptoms: no chest pain, no congestion, no fever, no headaches, no myalgias, no nausea, no rhinorrhea, no shortness of breath, no vomiting and no wheezing        History reviewed. No pertinent past medical history.  There are no problems to display for this patient.   History reviewed. No pertinent surgical history.   OB History   No obstetric history on file.     History reviewed. No pertinent family history.  Social History   Tobacco Use  . Smoking status: Current Every Day Smoker    Types: Cigarettes  Substance Use Topics  . Alcohol use: Yes  . Drug use: Yes    Types: Marijuana    Home Medications Prior to Admission medications   Medication Sig Start Date End Date Taking? Authorizing Provider  benzonatate (TESSALON) 100 MG capsule Take 1 capsule (100 mg total) by mouth every 8 (eight) hours. 07/16/20   Deno Etienne, DO  ondansetron (ZOFRAN ODT) 4 MG disintegrating tablet 4mg  ODT q4 hours prn nausea/vomit 07/16/20   Deno Etienne, DO    Allergies    Patient has no known allergies.  Review of Systems   Review of Systems  Constitutional: Negative for chills and fever.  HENT: Negative for congestion and rhinorrhea.   Eyes: Negative for redness and visual disturbance.   Respiratory: Negative for shortness of breath and wheezing.   Cardiovascular: Negative for chest pain and palpitations.  Gastrointestinal: Negative for nausea and vomiting.  Genitourinary: Negative for dysuria and urgency.  Musculoskeletal: Negative for arthralgias and myalgias.  Skin: Negative for pallor and wound.  Neurological: Negative for dizziness and headaches.    Physical Exam Updated Vital Signs BP (!) 164/86 (BP Location: Right Arm)   Pulse 88   Temp 98.2 F (36.8 C) (Oral)   Resp 18   Ht 5\' 4"  (1.626 m)   Wt 127 kg   SpO2 100%   BMI 48.06 kg/m   Physical Exam Vitals and nursing note reviewed.  Constitutional:      General: She is not in acute distress.    Appearance: She is well-developed and well-nourished. She is obese. She is not diaphoretic.  HENT:     Head: Normocephalic and atraumatic.  Eyes:     Extraocular Movements: EOM normal.     Pupils: Pupils are equal, round, and reactive to light.  Cardiovascular:     Rate and Rhythm: Normal rate and regular rhythm.     Heart sounds: No murmur heard. No friction rub. No gallop.   Pulmonary:     Effort: Pulmonary effort is normal.     Breath sounds: No wheezing or rales.  Abdominal:     General: There is no distension.     Palpations: Abdomen is  soft.     Tenderness: There is no abdominal tenderness.  Musculoskeletal:        General: No tenderness or edema.     Cervical back: Normal range of motion and neck supple.  Skin:    General: Skin is warm and dry.  Neurological:     Mental Status: She is alert and oriented to person, place, and time.  Psychiatric:        Mood and Affect: Mood and affect normal.        Behavior: Behavior normal.     ED Results / Procedures / Treatments   Labs (all labs ordered are listed, but only abnormal results are displayed) Labs Reviewed  RESP PANEL BY RT-PCR (FLU A&B, COVID) ARPGX2 - Abnormal; Notable for the following components:      Result Value   SARS  Coronavirus 2 by RT PCR POSITIVE (*)    All other components within normal limits    EKG None  Radiology No results found.  Procedures Procedures (including critical care time)  Medications Ordered in ED Medications - No data to display  ED Course  I have reviewed the triage vital signs and the nursing notes.  Pertinent labs & imaging results that were available during my care of the patient were reviewed by me and considered in my medical decision making (see chart for details).    MDM Rules/Calculators/A&P                          34 yo F with a chief complaints of a close exposure to the novel coronavirus.  Her Covid test here is positive.  She is well-appearing and nontoxic.  100% on room air.  No tachypnea.  Asymptomatic.  We will have her self isolate at home.  Prescribed cough and nausea medicine if needed.  Given Covid clinic follow-up information.  Karen Murray was evaluated in Emergency Department on 07/16/2020 for the symptoms described in the history of present illness. He/she was evaluated in the context of the global COVID-19 pandemic, which necessitated consideration that the patient might be at risk for infection with the SARS-CoV-2 virus that causes COVID-19. Institutional protocols and algorithms that pertain to the evaluation of patients at risk for COVID-19 are in a state of rapid change based on information released by regulatory bodies including the CDC and federal and state organizations. These policies and algorithms were followed during the patient's care in the ED.   6:55 PM:  I have discussed the diagnosis/risks/treatment options with the patient and believe the pt to be eligible for discharge home to follow-up with PCP, Covid clinic. We also discussed returning to the ED immediately if new or worsening sx occur. We discussed the sx which are most concerning (e.g., sudden worsening pain, fever, inability to tolerate by mouth) that necessitate immediate  return. Medications administered to the patient during their visit and any new prescriptions provided to the patient are listed below.  Medications given during this visit Medications - No data to display   The patient appears reasonably screen and/or stabilized for discharge and I doubt any other medical condition or other [facility] requiring further screening, evaluation, or treatment in the ED at this time prior to discharge.   Final Clinical Impression(s) / ED Diagnoses Final diagnoses:  COVID-19 virus infection    Rx / DC Orders ED Discharge Orders         Ordered    ondansetron (ZOFRAN ODT) 4  MG disintegrating tablet        07/16/20 1803    benzonatate (TESSALON) 100 MG capsule  Every 8 hours        07/16/20 1803           Deno Etienne, DO 07/16/20 1855

## 2020-07-16 NOTE — ED Triage Notes (Signed)
Pt states she wants to be tested for covid, exposed to sister and friend down the hall. Denies symptoms

## 2022-04-08 ENCOUNTER — Emergency Department (HOSPITAL_BASED_OUTPATIENT_CLINIC_OR_DEPARTMENT_OTHER)
Admission: EM | Admit: 2022-04-08 | Discharge: 2022-04-08 | Disposition: A | Discharge status: Home / Self Care | Payer: Self-pay | Attending: Emergency Medicine | Admitting: Emergency Medicine

## 2022-04-08 ENCOUNTER — Emergency Department (HOSPITAL_BASED_OUTPATIENT_CLINIC_OR_DEPARTMENT_OTHER): Payer: Self-pay

## 2022-04-08 ENCOUNTER — Other Ambulatory Visit: Payer: Self-pay

## 2022-04-08 ENCOUNTER — Encounter (HOSPITAL_BASED_OUTPATIENT_CLINIC_OR_DEPARTMENT_OTHER): Payer: Self-pay | Admitting: Emergency Medicine

## 2022-04-08 DIAGNOSIS — D259 Leiomyoma of uterus, unspecified: Secondary | ICD-10-CM | POA: Insufficient documentation

## 2022-04-08 LAB — URINALYSIS, ROUTINE W REFLEX MICROSCOPIC
Bilirubin Urine: NEGATIVE
Glucose, UA: NEGATIVE mg/dL
Ketones, ur: NEGATIVE mg/dL
Leukocytes,Ua: NEGATIVE
Nitrite: NEGATIVE
Protein, ur: NEGATIVE mg/dL
Specific Gravity, Urine: >=1.03 (ref 1.005–1.030)
pH: 5.5 (ref 5.0–8.0)

## 2022-04-08 LAB — CBC
HCT: 32.9 % — ABNORMAL LOW (ref 36.0–46.0)
Hemoglobin: 10.2 g/dL — ABNORMAL LOW (ref 12.0–15.0)
MCH: 26.2 pg (ref 26.0–34.0)
MCHC: 31 g/dL (ref 30.0–36.0)
MCV: 84.6 fL (ref 80.0–100.0)
Platelets: 362 10*3/uL (ref 150–400)
RBC: 3.89 MIL/uL (ref 3.87–5.11)
RDW: 16 % — ABNORMAL HIGH (ref 11.5–15.5)
WBC: 8 10*3/uL (ref 4.0–10.5)
nRBC: 0 % (ref 0.0–0.2)

## 2022-04-08 LAB — URINALYSIS, MICROSCOPIC (REFLEX)

## 2022-04-08 LAB — WET PREP, GENITAL
Clue Cells Wet Prep HPF POC: NONE SEEN
Sperm: NONE SEEN
Trich, Wet Prep: NONE SEEN
WBC, Wet Prep HPF POC: <10 (ref <10)
Yeast Wet Prep HPF POC: NONE SEEN

## 2022-04-08 LAB — COMPREHENSIVE METABOLIC PANEL
ALT: 14 U/L (ref 0–44)
AST: 18 U/L (ref 15–41)
Albumin: 3.3 g/dL — ABNORMAL LOW (ref 3.5–5.0)
Alkaline Phosphatase: 75 U/L (ref 38–126)
Anion gap: 4 — ABNORMAL LOW (ref 5–15)
BUN: 11 mg/dL (ref 6–20)
CO2: 25 mmol/L (ref 22–32)
Calcium: 8.5 mg/dL — ABNORMAL LOW (ref 8.9–10.3)
Chloride: 108 mmol/L (ref 98–111)
Creatinine, Ser: 0.69 mg/dL (ref 0.44–1.00)
GFR, Estimated: >60 mL/min (ref >60)
Glucose, Bld: 63 mg/dL — ABNORMAL LOW (ref 70–99)
Potassium: 3.9 mmol/L (ref 3.5–5.1)
Sodium: 137 mmol/L (ref 135–145)
Total Bilirubin: 0.3 mg/dL (ref 0.3–1.2)
Total Protein: 6.4 g/dL — ABNORMAL LOW (ref 6.5–8.1)

## 2022-04-08 LAB — CBG MONITORING, ED: Glucose-Capillary: 110 mg/dL — ABNORMAL HIGH (ref 70–99)

## 2022-04-08 LAB — PREGNANCY, URINE: Preg Test, Ur: NEGATIVE

## 2022-04-08 MED ORDER — KETOROLAC TROMETHAMINE 60 MG/2ML IM SOLN
60.0000 mg | Freq: Once | INTRAMUSCULAR | Status: AC
  Administered 2022-04-08: 60 mg via INTRAMUSCULAR
  Filled 2022-04-08: qty 2

## 2022-04-08 NOTE — ED Triage Notes (Signed)
Patient presents to ED via POV from home. Here with abdominal pain and spotting x 1 week.

## 2022-04-08 NOTE — Discharge Instructions (Addendum)
You were evaluated in the Emergency Department and after careful evaluation, we did not find any emergent condition requiring admission or further testing in the hospital.  Your work-up today showed that you have a uterine fibroid.  Please make sure to follow-up with [facility] [place], I provided their phone number in your discharge paperwork.  Please call them and schedule an appointment.  You may take Tylenol or ibuprofen for pain.  Please return to the Emergency Department if you experience any worsening of your condition.   Thank you for allowing [place] to be a part of your care.

## 2022-04-08 NOTE — ED Provider Notes (Signed)
[facility] EMERGENCY DEPARTMENT Provider Note   CSN: [id number] Arrival date & time: 04/08/22  1103     History  Chief Complaint  Patient presents with   Abdominal Pain    Karen Murray is a 36 y.o. female.  HPI 36 year old female with no significant medical history presents to the ER with complaints of pain in her right lower quadrant.  Patient states that she has been having on and off pain, that is worse at night since Wednesday of last week.  She also has noted some spotting with bright red blood.  She woke up this morning with more bleeding, and decided to come be evaluated in the ER.  She denies any nausea or vomiting.  She is sexually active with women and performs mainly oral sex none penetrative.  She is not on any birth control.    Home Medications Prior to Admission medications   Medication Sig Start Date End Date Taking? Authorizing Provider  benzonatate (TESSALON) 100 MG capsule Take 1 capsule (100 mg total) by mouth every 8 (eight) hours. 07/16/20   Deno Etienne, DO  ondansetron (ZOFRAN ODT) 4 MG disintegrating tablet '4mg'$  ODT q4 hours prn nausea/vomit 07/16/20   Deno Etienne, DO      Allergies    Patient has no known allergies.    Review of Systems   Review of Systems Ten systems reviewed and are negative for acute change, except as noted in the HPI.   Physical Exam Updated Vital Signs BP 136/80   Pulse 84   Temp 98.9 F (37.2 C)   Resp 15   SpO2 100%  Physical Exam Vitals and nursing note reviewed.  Constitutional:      General: She is not in acute distress.    Appearance: She is well-developed.  HENT:     Head: Normocephalic and atraumatic.  Eyes:     Conjunctiva/sclera: Conjunctivae normal.  Cardiovascular:     Rate and Rhythm: Normal rate and regular rhythm.     Heart sounds: No murmur heard. Pulmonary:     Effort: Pulmonary effort is normal. No respiratory distress.     Breath sounds: Normal breath sounds.  Abdominal:      Palpations: Abdomen is soft.     Tenderness: There is abdominal tenderness in the right lower quadrant.  Genitourinary:    Comments: Pelvic exam performed with RN at bedside.  Mild right-sided adnexal tenderness, no cervical motion tenderness, no left-sided adnexal tenderness.  No significant vaginal bleeding or discharge. Musculoskeletal:        General: No swelling.     Cervical back: Neck supple.  Skin:    General: Skin is warm and dry.     Capillary Refill: Capillary refill takes less than 2 seconds.  Neurological:     Mental Status: She is alert.  Psychiatric:        Mood and Affect: Mood normal.     ED Results / Procedures / Treatments   Labs (all labs ordered are listed, but only abnormal results are displayed) Labs Reviewed  CBC - Abnormal; Notable for the following components:      Result Value   Hemoglobin 10.2 (*)    HCT 32.9 (*)    RDW 16.0 (*)    All other components within normal limits  COMPREHENSIVE METABOLIC PANEL - Abnormal; Notable for the following components:   Glucose, Bld 63 (*)    Calcium 8.5 (*)    Total Protein 6.4 (*)    Albumin 3.3 (*)  Anion gap 4 (*)    All other components within normal limits  URINALYSIS, ROUTINE W REFLEX MICROSCOPIC - Abnormal; Notable for the following components:   Hgb urine dipstick TRACE (*)    All other components within normal limits  URINALYSIS, MICROSCOPIC (REFLEX) - Abnormal; Notable for the following components:   Bacteria, UA RARE (*)    All other components within normal limits  CBG MONITORING, ED - Abnormal; Notable for the following components:   Glucose-Capillary 110 (*)    All other components within normal limits  WET PREP, GENITAL  PREGNANCY, URINE  GC/CHLAMYDIA PROBE AMP ([place]) NOT AT [facility]    EKG None  Radiology US PELVIC COMPLETE W TRANSVAGINAL AND TORSION R/O  Result Date: 04/08/2022 CLINICAL DATA:  Pelvic pain EXAM: TRANSABDOMINAL AND TRANSVAGINAL ULTRASOUND OF PELVIS DOPPLER  ULTRASOUND OF OVARIES TECHNIQUE: Both transabdominal and transvaginal ultrasound examinations of the pelvis were performed. Transabdominal technique was performed for global imaging of the pelvis including uterus, ovaries, adnexal regions, and pelvic cul-de-sac. It was necessary to proceed with endovaginal exam following the transabdominal exam to visualize the uterus, endometrium, ovaries, and adnexa. Color and duplex Doppler ultrasound was utilized to evaluate blood flow to the ovaries. COMPARISON:  None Available. FINDINGS: Uterus Measurements: 9.7 x 8.8 x 8.0 cm = volume: 355 mL. Uterus is anteverted. A large fibroid is noted, measuring 8.0 x 6.4 x 6.5 cm. Endometrium Thickness: 4 mm. No focal abnormality visualized, although visualization is somewhat limited by uterine fibroid. Right ovary Measurements: 4.7 x 2.6 x 3.2 cm = volume: 20.6 mL. The right ovary is not well visualized transvaginally due to sound attenuation. Left ovary Measurements: 2.3 x 2.2 x 2.1 cm = volume: 5.6 mL. The left ovary is not seen transvaginally. Pulsed Doppler evaluation of both ovaries could only be evaluated transabdominally and demonstrates normal low-resistance arterial and venous waveforms. Other findings No abnormal free fluid. IMPRESSION: Evaluation is somewhat limited by sound attenuation due to a large uterine fibroid. Within this limitation, no additional acute process in the pelvis. Electronically Signed   By: Merilyn Baba M.D.   On: 04/08/2022 14:48    Procedures Procedures    Medications Ordered in ED Medications  ketorolac (TORADOL) injection 60 mg (60 mg Intramuscular Given 04/08/22 1327)    ED Course/ Medical Decision Making/ A&P Clinical Course as of 04/08/22 1514  Mon Apr 08, 2022  1459 US PELVIC COMPLETE W TRANSVAGINAL AND TORSION R/O IMPRESSION: Evaluation is somewhat limited by sound attenuation due to a large uterine fibroid. Within this limitation, no additional acute process in the pelvis.    [MB]    Clinical Course User Index [MB] Garald Balding, PA-C                           Medical Decision Making Amount and/or Complexity of Data Reviewed Labs: ordered. Radiology: ordered. Decision-making details documented in ED Course.  Risk Prescription drug management.  36 year old female presenting with occasional right lower quadrant pain and spotting.  Vitals overall reassuring.  Pelvic exam with mild adnexal tenderness and right lower quadrant tenderness, no peritoneal signs, no guarding.  No cervical motion tenderness.  Differential diagnosis includes variant torsion, TOA, fibroids, appendicitis, UTI, constipation  Labs ordered, reviewed and interpreted by me.  CBC with hemoglobin of 10.2 no prior labs to compare to.  CMP with no significant lecture normalities, normal renal function.  UA with trace hemoglobin.  Negative pregnancy test.  Lenard Forth  prep unremarkable.  UA not consistent with UTI.  Given mild adnexal tenderness, pelvic ultrasound was ordered.  This was negative for acute process but did note a large uterine fibroid.  Patient was given Toradol in the ER, overall pain is controlled.  [facility] to [facility]  Low suspicion for appendicitis or intra-abdominal process.  I discussed the findings with the patient, she voices understanding and is agreeable.  Stable for discharge.  Final Clinical Impression(s) / ED Diagnoses Final diagnoses:  Uterine leiomyoma, unspecified location    Rx / DC Orders ED Discharge Orders     None         Garald Balding, PA-C 04/08/22 [place], DO 04/09/22 [phone]

## 2022-04-09 LAB — GC/CHLAMYDIA PROBE AMP (~~LOC~~) NOT AT ARMC
Chlamydia: NEGATIVE
Comment: NEGATIVE
Comment: NORMAL
Neisseria Gonorrhea: NEGATIVE

## 2022-07-02 ENCOUNTER — Encounter: Payer: Self-pay | Admitting: General Practice

## 2022-08-09 ENCOUNTER — Other Ambulatory Visit (HOSPITAL_COMMUNITY)
Admission: RE | Admit: 2022-08-09 | Discharge: 2022-08-09 | Disposition: A | Discharge status: Home / Self Care | Payer: Medicaid Other | Source: Ambulatory Visit | Attending: Obstetrics and Gynecology | Admitting: Obstetrics and Gynecology

## 2022-08-09 ENCOUNTER — Ambulatory Visit (INDEPENDENT_AMBULATORY_CARE_PROVIDER_SITE_OTHER): Payer: Medicaid Other | Admitting: Obstetrics and Gynecology

## 2022-08-09 ENCOUNTER — Encounter: Payer: Self-pay | Admitting: Obstetrics and Gynecology

## 2022-08-09 VITALS — BP 130/72 | HR 83 | Ht 64.0 in | Wt 285.0 lb

## 2022-08-09 DIAGNOSIS — Z124 Encounter for screening for malignant neoplasm of cervix: Secondary | ICD-10-CM | POA: Diagnosis not present

## 2022-08-09 DIAGNOSIS — N939 Abnormal uterine and vaginal bleeding, unspecified: Secondary | ICD-10-CM | POA: Diagnosis not present

## 2022-08-09 DIAGNOSIS — F17200 Nicotine dependence, unspecified, uncomplicated: Secondary | ICD-10-CM

## 2022-08-09 NOTE — Progress Notes (Signed)
NEW GYNECOLOGY PATIENT Patient name: Karen Murray MRN [id number]  Date of birth: 18-Aug-1985 Chief Complaint:   Fibroids     History:  Karen Murray is a 37 y.o. No obstetric history on file. being seen today for fibroid and HMB.    Bleeding in between menses prompted ED visit. Over at least the last year has noticed increasingly heavier menses with spotting prior to onset.Marland Kitchen spotting for [place] to 2 weeks. Menses are lasting 6-7 days, used to be 4-5 days - unsure exactly when it changed,may be > 1 year.. [place] on [place] due to convenience, when out continued tampons but thought they were making her menses last longer so she switched back to pads but continued to have heavy bleeding with clots.  No prior medicaiton for menstrual control. Pain is sometimes bad but typically just the first day or so, now for 3 days. Ibuprofen helps.  May want to be pregnant at some point - thinks more likely will be egg donor and not necessarily carry. Female partner(s). Dos not engage in penetrative intercourse. Occasional pelvic pain outside of menses, tyipcally with the spotting.   Cigarette smoking, no PCP as of yet but interested in quitting.   More interested in shrinking or having the fibroid removed due to reproductive desires.       Gynecologic History Patient's last menstrual period was 08/03/2022. Contraception: none Last Pap: unsure Last Mammogram: n/a Last Colonoscopy: n/a  Obstetric History OB History  Gravida Para Term Preterm AB Living  0 0 0 0 0 0  SAB IAB Ectopic Multiple Live Births  0 0 0 0 0    History reviewed. No pertinent past medical history.  History reviewed. No pertinent surgical history.  Current Outpatient Medications on File Prior to Visit  Medication Sig Dispense Refill   benzonatate (TESSALON) 100 MG capsule Take 1 capsule (100 mg total) by mouth every 8 (eight) hours. 21 capsule 0   ondansetron (ZOFRAN ODT) 4 MG disintegrating tablet '4mg'$  ODT q4 hours prn  nausea/vomit 20 tablet 0   No current facility-administered medications on file prior to visit.    No Known Allergies  Social History:  reports that she has been smoking cigarettes. She has been smoking an average of 1 pack per day. She does not have any smokeless tobacco history on file. She reports current alcohol use. She reports current drug use. Drug: Marijuana.  Family History  Problem Relation Age of Onset   Cancer Maternal Grandmother        breast   Hypertension Neg Hx    Colon cancer Neg Hx    Diabetes Neg Hx     The following portions of the patient's history were reviewed and updated as appropriate: allergies, current medications, past family history, past medical history, past social history, past surgical history and problem list.  Review of Systems Pertinent items noted in HPI and remainder of comprehensive ROS otherwise negative.  Physical Exam:  BP 130/72   Pulse 83   Ht '5\' 4"'$  (1.626 m)   Wt 285 lb (129.3 kg)   LMP 08/03/2022   BMI 48.92 kg/m  Physical Exam Vitals and nursing note reviewed. Exam conducted with a chaperone present.  Constitutional:      Appearance: Normal appearance.  Cardiovascular:     Rate and Rhythm: Normal rate.  Pulmonary:     Effort: Pulmonary effort is normal.     Breath sounds: Normal breath sounds.  Genitourinary:    General: Normal vulva.  Exam position: Lithotomy position.     Vagina: Normal.     Cervix: Normal.     Uterus: Normal.      Adnexa: Right adnexa normal and left adnexa normal.       Right: No mass.         Left: No mass.       Comments: Small nulliparous cervix Enlarge, mobile uterus with palpable anterior fibroid/firmness Neurological:     General: No focal deficit present.     Mental Status: She is alert and oriented to person, place, and time.  Psychiatric:        Mood and Affect: Mood normal.        Behavior: Behavior normal.        Thought Content: Thought content normal.        Judgment:  Judgment normal.      Assessment and Plan:   1. Abnormal uterine bleeding (AUB) Discussed various methods for management.  Discussed management options for abnormal uterine bleeding including NSAIDs (Naproxen), oral progesterone, Depo Provera, Levonogestrel IUD, endometrial ablation or hysterectomy as definitive surgical management.  Discussed risks and benefits of each method.   Patient desires surgical procedure for fibroid management that is uterine sparing given reproductive goals. CHCs contraindicated. Not concerned about pregnancy risk given female partners only.    2. Screening for cervical cancer Routine pap collected - Cytology - PAP( [place])  3. Current smoker Referral to cessation program  Encouraged to follow up with PCP - has been assigned  - Ambulatory referral to Smoking Cessation Program  Routine preventative health maintenance measures emphasized. Please refer to After Visit Summary for other counseling recommendations.   Follow-up: No follow-ups on file.   Darliss Cheney, MD Obstetrician & Gynecologist, Faculty [facility] [employer], [place]

## 2022-08-09 NOTE — Progress Notes (Signed)
Patient was seen in ED and imaging revealed she has fibroids. Having heavy bleeding with some pain during periods.  Patient states with her last period (in Dec) she spotting for one week before her period started.

## 2022-08-09 NOTE — Patient Instructions (Addendum)
Uterine artery embolization   [place] procedure  Myomectomy  Medications: myfembree and lupron   Tranexamic acid - non-hormonal option to manage bleeding but does not shrink fibroid

## 2022-08-15 LAB — CYTOLOGY - PAP
Comment: NEGATIVE
Diagnosis: NEGATIVE
Diagnosis: REACTIVE
High risk HPV: NEGATIVE

## 2022-08-20 ENCOUNTER — Other Ambulatory Visit: Payer: Self-pay | Admitting: Obstetrics and Gynecology

## 2022-08-20 ENCOUNTER — Ambulatory Visit: Payer: Medicaid Other | Admitting: Obstetrics and Gynecology

## 2022-08-20 ENCOUNTER — Encounter: Payer: Self-pay | Admitting: Obstetrics and Gynecology

## 2022-08-20 VITALS — BP 135/88 | HR 80 | Wt 287.0 lb

## 2022-08-20 DIAGNOSIS — D252 Subserosal leiomyoma of uterus: Secondary | ICD-10-CM | POA: Diagnosis not present

## 2022-08-20 DIAGNOSIS — D250 Submucous leiomyoma of uterus: Secondary | ICD-10-CM

## 2022-08-20 DIAGNOSIS — D251 Intramural leiomyoma of uterus: Secondary | ICD-10-CM | POA: Diagnosis not present

## 2022-08-20 DIAGNOSIS — N939 Abnormal uterine and vaginal bleeding, unspecified: Secondary | ICD-10-CM | POA: Diagnosis not present

## 2022-08-20 DIAGNOSIS — D259 Leiomyoma of uterus, unspecified: Secondary | ICD-10-CM | POA: Insufficient documentation

## 2022-08-20 NOTE — Progress Notes (Unsigned)
Pt was recently seen by Dr Currie Paris in [facility] and was referred for [facility] procedure.   Pt states she is having longer, heavier cycles with spotting between.

## 2022-08-20 NOTE — Progress Notes (Unsigned)
  CC: fibroid consultation Subjective:    Patient ID: Karen Murray, female    DOB: [birth year], 37 y.o.   MRN: [id number]  HPI 37 yo G0 seen for consultation for treatment of fibroid.  Previous ultrasound has shown large, solitary 8  cm fibroid.  Pt desires treatment to address the fibroid and manage mass effect and mild irregular bleeding.  Pt desires treatments which will maintain fertility, but in reality she is most concerned regarding the utilization of her own eggs.  Discussed treatment options including UFE, myfembree, Sonata procedure, myomectomy or hysterectomy.  Pt advised not carrying pregnancy if she receives embolization, but egg retireval should not be affected.   Review of Systems     Objective:   Physical Exam Constitutional:      Appearance: She is obese.  HENT:     Head: Normocephalic and atraumatic.  Abdominal:     Palpations: Abdomen is soft. There is no mass.     Tenderness: There is no abdominal tenderness.  Genitourinary:    Comments: Exam difficult due to adiposity. Mobile uterus noted with enlarged central mass. Neurological:     Mental Status: She is alert.    Vitals:   08/20/22 1452  BP: 135/88  Pulse: 80   CLINICAL DATA:  Pelvic pain   EXAM: TRANSABDOMINAL AND TRANSVAGINAL ULTRASOUND OF PELVIS   DOPPLER ULTRASOUND OF OVARIES   TECHNIQUE: Both transabdominal and transvaginal ultrasound examinations of the pelvis were performed. Transabdominal technique was performed for global imaging of the pelvis including uterus, ovaries, adnexal regions, and pelvic cul-de-sac.   It was necessary to proceed with endovaginal exam following the transabdominal exam to visualize the uterus, endometrium, ovaries, and adnexa. Color and duplex Doppler ultrasound was utilized to evaluate blood flow to the ovaries.   COMPARISON:  None Available.   FINDINGS: Uterus   Measurements: 9.7 x 8.8 x 8.0 cm = volume: 355 mL. Uterus is anteverted. A large fibroid is  noted, measuring 8.0 x 6.4 x 6.5 cm.   Endometrium   Thickness: 4 mm. No focal abnormality visualized, although visualization is somewhat limited by uterine fibroid.   Right ovary   Measurements: 4.7 x 2.6 x 3.2 cm = volume: 20.6 mL. The right ovary is not well visualized transvaginally due to sound attenuation.   Left ovary   Measurements: 2.3 x 2.2 x 2.1 cm = volume: 5.6 mL. The left ovary is not seen transvaginally.   Pulsed Doppler evaluation of both ovaries could only be evaluated transabdominally and demonstrates normal low-resistance arterial and venous waveforms.   Other findings   No abnormal free fluid.   IMPRESSION: Evaluation is somewhat limited by sound attenuation due to a large uterine fibroid. Within this limitation, no additional acute process in the pelvis.        Assessment & Plan:   1. Abnormal uterine bleeding (AUB)  - Ambulatory referral to Interventional Radiology  2. Intramural and subserous leiomyoma of uterus After full discussion, pt is willing to have consult with interventional radiology for UFE.  She is aware pregnancy is not advised, but her eggs could still be harvested as her ovaries will not be affected. There was concern that she may not get full treatment or significant decrease in size with the Sonata procedure. - Ambulatory referral to Interventional Radiology    Griffin Basil, MD Faculty Attending, [facility]

## 2022-08-30 ENCOUNTER — Other Ambulatory Visit: Payer: Self-pay | Admitting: Interventional Radiology

## 2022-08-30 ENCOUNTER — Ambulatory Visit
Admission: RE | Admit: 2022-08-30 | Discharge: 2022-08-30 | Disposition: A | Discharge status: Home / Self Care | Payer: Medicaid Other | Source: Ambulatory Visit | Attending: Obstetrics and Gynecology | Admitting: Obstetrics and Gynecology

## 2022-08-30 DIAGNOSIS — D250 Submucous leiomyoma of uterus: Secondary | ICD-10-CM

## 2022-08-30 HISTORY — PX: IR RADIOLOGIST EVAL & MGMT: IMG5224

## 2022-08-30 NOTE — H&P (Signed)
Reason for visit: Dysfunctional bleeding from uterine fibroids  at the request of Bass,Lawrence A MD  Care Team(s): Primary Care: Patient, No Pcp Per GYN: Darliss Cheney, MD   Virtual Visit via Telephone Note   I connected with on Karen Murray on 08/30/22 by telephone and verified that I am speaking with the correct person using two identifiers. I discussed the limitations, risks, security and privacy concerns of performing an evaluation and management service by telephone and the availability of in-person appointments.   History of Present Illness:  Karen Murray is a 37 y/o F G0 without significant PMHx is referred for dysfunctional uterine bleeding. Pt with long standing uterine fibroids and heavy menstrual and peri-menstrual bleeding with ER visits documented as back as 05/2013 when she was 27. Most recent ER visit documented on 04/08/22 when she was experiencing increased abdominal pain and spotting for > 1 wk. She was also found to be slightly anemic on presentation with Hb 10. She was referred for GYN for evaluation and established care with Dr Currie Paris who referred her to Dr Elgie Congo to discuss intrauterine US-guided RFA [facility]). Pt is nulligravid and per documentation she was not a suitable candidate, leading to her referral to interventional radiology.  Pt describes her symptoms as fatigue, cramping, spotting between menses, and abnormal uterine bleeding with her last period being heavy and lasting for 7 days. She takes OTCs for her cramps. She denies bulk symptoms. Her LMP was 08/03/22. Pap smear (08/09/22) was negative.  Review of Systems: A 12 point ROS discussed and pertinent positives are indicated in the HPI above.  All other systems are negative.   No past medical history on file.  Past Surgical History:  Procedure Laterality Date   IR RADIOLOGIST EVAL & MGMT  08/30/2022    Allergies: Patient has no known allergies.  Medications: Prior to Admission  medications   Medication Sig Start Date End Date Taking? Authorizing Provider  benzonatate (TESSALON) 100 MG capsule Take 1 capsule (100 mg total) by mouth every 8 (eight) hours. 07/16/20   Deno Etienne, DO  ondansetron (ZOFRAN ODT) 4 MG disintegrating tablet 2m ODT q4 hours prn nausea/vomit 07/16/20   FDeno Etienne DO     Family History  Problem Relation Age of Onset   Cancer Maternal Grandmother        breast   Hypertension Neg Hx    Colon cancer Neg Hx    Diabetes Neg Hx     Social History   Socioeconomic History   Marital status: Single    Spouse name: Not on file   Number of children: Not on file   Years of education: Not on file   Highest education level: Not on file  Occupational History   Not on file  Tobacco Use   Smoking status: Every Day    Packs/day: 1.00    Types: Cigarettes   Smokeless tobacco: Not on file  Vaping Use   Vaping Use: Never used  Substance and Sexual Activity   Alcohol use: Yes    Comment: like once a week   Drug use: Yes    Types: Marijuana   Sexual activity: Yes    Birth control/protection: None  Other Topics Concern   Not on file  Social History Narrative   Not on file   Social Determinants of Health   Financial Resource Strain: Not on file  Food Insecurity: Not on file  Transportation Needs: Not on file  Physical Activity: Not on file  Stress: Not on file  Social Connections: Not on file    Review of Systems As above  Vital Signs: LMP 08/03/2022   Physical Exam Deferred secondary to virtual visit.    Imaging:  US Pelvis, 04/08/22 Independently reviewed, demonstrating an enlarged and fibroid uterus.     Labs:  CBC: Recent Labs    04/08/22 1200  WBC 8.0  HGB 10.2*  HCT 32.9*  PLT 362    COAGS: No results for input(s): "INR", "APTT" in the last 8760 hours.  BMP: Recent Labs    04/08/22 1200  NA 137  K 3.9  CL 108  CO2 25  GLUCOSE 63*  BUN 11  CALCIUM 8.5*  CREATININE 0.69  GFRNONAA >60     LIVER FUNCTION TESTS: Recent Labs    04/08/22 1200  BILITOT 0.3  AST 18  ALT 14  ALKPHOS 75  PROT 6.4*  ALBUMIN 3.3*    Pathology:   Gynecological Cytology Report, 08/09/22  Clinical History: LMP: 08/03/2022  Specimen Submitted: A. CERVICOVAGINAL, LIQUID BASED PAP SMEAR:  SPECIMEN ADEQUACY: Satisfactory for evaluation; transformation zone component PRESENT. INTERPRETATION: - Negative for Intraepithelial Lesions or Malignancy (NILM) - Benign reactive/reparative changes  MOLECULAR RESULTS: HPV High Risk Not Detected   Assessment and Plan:  Karen Murray Is a 37 y.o. F who presents with symptomatic uterine fibroids and dysfunctional menstrual bleeding.   She is interested in pursuing a minimally-invasive option for the treatment of her fibroids at this time, and is curious about Uterine Artery Embolization.    She desires future pregnancies. We discussed that there have been numerous successful full-term pregnancies after [place], with a reported pregnancy rate of 40% in women desiring pregnancy, with a slightly higher risk for abnormal placentation after [place].  I also explained that abnormal placentation can also be seen after a D&C procedure, C-section, and hysteroscopic myomectomy.     She wishes to proceed with the procedure. I recommended that secondary to her lack of contrasted cross-sectional imaging available to evaluate her uterus and fibroid burden enhancement, a contrasted MRI pelvis is warranted.   The procedure has been fully reviewed with the patient/patient's authorized representative. The risks, benefits and alternatives have been explained, and the patient/patient's authorized representative has consented to the procedure.    *MR Pelvis +C to evaluate for fibroid enhancement *OK to proceed to schedule based on mutual availability. *Procedure to be performed at [facility] *Will plan for trans-radial access. *Same day procedure, no overnight admission.   Thank  you for this interesting consult.  I greatly enjoyed meeting [facility] and look forward to participating in their care.   A copy of this report was sent to the requesting provider on this date.  Electronically Signed:  Michaelle Birks, MD [facility]   Pager. [phone] Clinic. [phone]   I spent a total of 45 Minutes of non-face-to-face time in clinical consultation, greater than 50% of which was counseling/coordinating care for [facility] evaluation for dysfunctional uterine bleeding.

## 2022-09-13 ENCOUNTER — Ambulatory Visit (HOSPITAL_COMMUNITY)
Admission: RE | Admit: 2022-09-13 | Discharge: 2022-09-13 | Disposition: A | Discharge status: Home / Self Care | Payer: Medicaid Other | Source: Ambulatory Visit | Attending: Interventional Radiology | Admitting: Interventional Radiology

## 2022-09-13 DIAGNOSIS — D250 Submucous leiomyoma of uterus: Secondary | ICD-10-CM | POA: Diagnosis present

## 2022-09-13 MED ORDER — GADOBUTROL 1 MMOL/ML IV SOLN
10.0000 mL | Freq: Once | INTRAVENOUS | Status: AC | PRN
  Administered 2022-09-13: 10 mL via INTRAVENOUS

## 2022-09-20 ENCOUNTER — Telehealth: Payer: Self-pay | Admitting: Obstetrics and Gynecology

## 2022-09-20 NOTE — Telephone Encounter (Signed)
Called patient, confirmed ID x2.   Noted to be following up with IR for [place]. Verified patient is more leaning towards egg retrieval and gestational carrier vs carrying pregnancy herself. Pregnancy after [place] is not advised.   Also mentioned having passed a large blood clot - not painful and seemed to form overnight. Likely had bleeding overnight during menses that organized into clot and passed when upright.

## 2022-09-27 ENCOUNTER — Other Ambulatory Visit (HOSPITAL_COMMUNITY): Payer: Self-pay | Admitting: Interventional Radiology

## 2022-09-27 DIAGNOSIS — D259 Leiomyoma of uterus, unspecified: Secondary | ICD-10-CM

## 2022-10-21 ENCOUNTER — Other Ambulatory Visit: Payer: Self-pay | Admitting: Radiology

## 2022-10-21 DIAGNOSIS — D259 Leiomyoma of uterus, unspecified: Secondary | ICD-10-CM

## 2022-10-21 NOTE — H&P (Signed)
Referring Physician(s): Bass,Lawrence/Ajewole,C  Supervising Physician: Michaelle Birks  Patient Status:  [facility] OP  Chief Complaint: Symptomatic uterine fibroids   Subjective: Pt known to IR team from consultation with Dr. Maryelizabeth Kaufmann on 08/30/22 to discuss treatment options for symptomatic uterine fibroids. Following d/w Dr. Maryelizabeth Kaufmann she was deemed an appropriate candidate for bilateral uterine artery embolization and presents today for the procedure.  She currently denies fever, headache, chest pain, dyspnea, cough, abdominal/back pain, nausea, vomiting, dysuria.  She does have a history of menorrhagia /dysfunctional uterine bleeding.    No past medical history on file. Thyroid nodules, COVID 2021 Past Surgical History:  Procedure Laterality Date   IR RADIOLOGIST EVAL & MGMT  08/30/2022      Allergies: Patient has no known allergies.  Medications: Prior to Admission medications   Medication Sig Start Date End Date Taking? Authorizing Provider  benzonatate (TESSALON) 100 MG capsule Take 1 capsule (100 mg total) by mouth every 8 (eight) hours. 07/16/20   Deno Etienne, DO  ondansetron (ZOFRAN ODT) 4 MG disintegrating tablet 4mg  ODT q4 hours prn nausea/vomit 07/16/20   Deno Etienne, DO     Vital Signs: Vitals:   10/22/22 0742  BP: (!) 153/90  Pulse: 82  Resp: 15  Temp: 98.3 F (36.8 C)  SpO2: 96%       Code Status: FULL CODE    Physical Exam: Awake, alert.  Chest clear to auscultation bilaterally.  Heart with regular rate and rhythm.  Abdomen obese, soft, positive bowel sounds, nontender.  No lower extremity edema.  Intact distal pulses.    Imaging: No results found.  Labs:  CBC: Recent Labs    04/08/22 1200  WBC 8.0  HGB 10.2*  HCT 32.9*  PLT 362    COAGS: No results for input(s): "INR", "APTT" in the last 8760 hours.  BMP: Recent Labs    04/08/22 1200  NA 137  K 3.9  CL 108  CO2 25  GLUCOSE 63*  BUN 11  CALCIUM 8.5*  CREATININE 0.69  GFRNONAA  >60    LIVER FUNCTION TESTS: Recent Labs    04/08/22 1200  BILITOT 0.3  AST 18  ALT 14  ALKPHOS 75  PROT 6.4*  ALBUMIN 3.3*    Assessment and Plan: 37 yo female smoker with hx symptomatic uterine fibroids, anemia, prior consultation with Dr. Maryelizabeth Kaufmann on 08/30/22; deemed an appropriate candidate for bilateral uterine artery embolization; presents today for the procedure; Risks and benefits of procedure were discussed with the patient including, but not limited to bleeding, infection, vascular injury or contrast induced renal failure.  This interventional procedure involves the use of X-rays and because of the nature of the planned procedure, it is possible that we will have prolonged use of X-ray fluoroscopy.  Potential radiation risks to you include (but are not limited to) the following: - A slightly elevated risk for cancer  several years later in life. This risk is typically less than 0.5% percent. This risk is low in comparison to the normal incidence of human cancer, which is 33% for women and 50% for men according to the [place]. - Radiation induced injury can include skin redness, resembling a rash, tissue breakdown / ulcers and hair loss (which can be temporary or permanent).   The likelihood of either of these occurring depends on the difficulty of the procedure and whether you are sensitive to radiation due to previous procedures, disease, or genetic conditions.   IF your procedure requires a prolonged  use of radiation, you will be notified and given written instructions for further action.  It is your responsibility to monitor the irradiated area for the 2 weeks following the procedure and to notify your physician if you are concerned that you have suffered a radiation induced injury.    All of the patient's questions were answered, patient is agreeable to proceed.  Consent signed and in chart.   LABS PENDING   Electronically Signed: D. Rowe Robert,  PA-C 10/21/2022, 4:08 PM   I spent a total of 25 minutes  at the the patient's bedside AND on the patient's hospital floor or unit, greater than 50% of which was counseling/coordinating care for bilateral uterine artery embolization

## 2022-10-22 ENCOUNTER — Other Ambulatory Visit: Payer: Self-pay

## 2022-10-22 ENCOUNTER — Other Ambulatory Visit (HOSPITAL_COMMUNITY): Payer: Self-pay | Admitting: Interventional Radiology

## 2022-10-22 ENCOUNTER — Other Ambulatory Visit (HOSPITAL_COMMUNITY): Payer: Self-pay

## 2022-10-22 ENCOUNTER — Ambulatory Visit (HOSPITAL_COMMUNITY)
Admission: RE | Admit: 2022-10-22 | Discharge: 2022-10-22 | Disposition: A | Discharge status: Home / Self Care | Payer: Medicaid Other | Source: Ambulatory Visit | Attending: Interventional Radiology | Admitting: Interventional Radiology

## 2022-10-22 ENCOUNTER — Encounter (HOSPITAL_COMMUNITY): Payer: Self-pay

## 2022-10-22 DIAGNOSIS — D259 Leiomyoma of uterus, unspecified: Secondary | ICD-10-CM

## 2022-10-22 DIAGNOSIS — Z8616 Personal history of COVID-19: Secondary | ICD-10-CM | POA: Diagnosis not present

## 2022-10-22 DIAGNOSIS — F17200 Nicotine dependence, unspecified, uncomplicated: Secondary | ICD-10-CM | POA: Diagnosis not present

## 2022-10-22 HISTORY — PX: IR EMBO TUMOR ORGAN ISCHEMIA INFARCT INC GUIDE ROADMAPPING: IMG5449

## 2022-10-22 HISTORY — PX: IR US GUIDE VASC ACCESS LEFT: IMG2389

## 2022-10-22 HISTORY — PX: IR ANGIOGRAM SELECTIVE EACH ADDITIONAL VESSEL: IMG667

## 2022-10-22 HISTORY — PX: IR ANGIOGRAM PELVIS SELECTIVE OR SUPRASELECTIVE: IMG661

## 2022-10-22 LAB — CBC WITH DIFFERENTIAL/PLATELET
Abs Immature Granulocytes: 0.01 10*3/uL (ref 0.00–0.07)
Basophils Absolute: 0 10*3/uL (ref 0.0–0.1)
Basophils Relative: 1 %
Eosinophils Absolute: 0.4 10*3/uL (ref 0.0–0.5)
Eosinophils Relative: 7 %
HCT: 28.2 % — ABNORMAL LOW (ref 36.0–46.0)
Hemoglobin: 8.4 g/dL — ABNORMAL LOW (ref 12.0–15.0)
Immature Granulocytes: 0 %
Lymphocytes Relative: 24 %
Lymphs Abs: 1.3 10*3/uL (ref 0.7–4.0)
MCH: 23.7 pg — ABNORMAL LOW (ref 26.0–34.0)
MCHC: 29.8 g/dL — ABNORMAL LOW (ref 30.0–36.0)
MCV: 79.4 fL — ABNORMAL LOW (ref 80.0–100.0)
Monocytes Absolute: 0.4 10*3/uL (ref 0.1–1.0)
Monocytes Relative: 8 %
Neutro Abs: 3.2 10*3/uL (ref 1.7–7.7)
Neutrophils Relative %: 60 %
Platelets: 326 10*3/uL (ref 150–400)
RBC: 3.55 MIL/uL — ABNORMAL LOW (ref 3.87–5.11)
RDW: 17.6 % — ABNORMAL HIGH (ref 11.5–15.5)
WBC: 5.2 10*3/uL (ref 4.0–10.5)
nRBC: 0 % (ref 0.0–0.2)

## 2022-10-22 LAB — PROTIME-INR
INR: 1.1 (ref 0.8–1.2)
Prothrombin Time: 14.5 seconds (ref 11.4–15.2)

## 2022-10-22 LAB — BASIC METABOLIC PANEL
Anion gap: 9 (ref 5–15)
BUN: 8 mg/dL (ref 6–20)
CO2: 24 mmol/L (ref 22–32)
Calcium: 9.1 mg/dL (ref 8.9–10.3)
Chloride: 105 mmol/L (ref 98–111)
Creatinine, Ser: 0.72 mg/dL (ref 0.44–1.00)
GFR, Estimated: >60 mL/min (ref >60)
Glucose, Bld: 90 mg/dL (ref 70–99)
Potassium: 3.6 mmol/L (ref 3.5–5.1)
Sodium: 138 mmol/L (ref 135–145)

## 2022-10-22 LAB — HCG, SERUM, QUALITATIVE: Preg, Serum: NEGATIVE

## 2022-10-22 MED ORDER — FENTANYL CITRATE (PF) 100 MCG/2ML IJ SOLN
INTRAMUSCULAR | Status: AC | PRN
  Administered 2022-10-22 (×2): 50 ug via INTRAVENOUS

## 2022-10-22 MED ORDER — SODIUM CHLORIDE 0.9 % IV SOLN: 12.5000 mg | Freq: Four times a day (QID) | INTRAVENOUS | Status: DC | PRN

## 2022-10-22 MED ORDER — SODIUM CHLORIDE 0.9 % IV SOLN: INTRAVENOUS | Status: AC

## 2022-10-22 MED ORDER — NAPROXEN 500 MG PO TABS
500.0000 mg | ORAL_TABLET | Freq: Two times a day (BID) | ORAL | 0 refills | Status: AC
  Filled 2022-10-22: qty 14, 7d supply, fill #0

## 2022-10-22 MED ORDER — HYDROMORPHONE HCL 1 MG/ML IJ SOLN: 0.5000 mg | INTRAMUSCULAR | Status: DC | PRN

## 2022-10-22 MED ORDER — GELATIN ABSORBABLE 12-7 MM EX MISC: 1.0000 | CUTANEOUS | Status: DC | PRN

## 2022-10-22 MED ORDER — HEPARIN SODIUM (PORCINE) 1000 UNIT/ML IJ SOLN
INTRAMUSCULAR | Status: AC
  Filled 2022-10-22: qty 10

## 2022-10-22 MED ORDER — FENTANYL CITRATE (PF) 100 MCG/2ML IJ SOLN
INTRAMUSCULAR | Status: AC
  Filled 2022-10-22: qty 4

## 2022-10-22 MED ORDER — KETOROLAC TROMETHAMINE 30 MG/ML IJ SOLN
INTRAMUSCULAR | Status: AC
  Filled 2022-10-22: qty 1

## 2022-10-22 MED ORDER — IOHEXOL 300 MG/ML  SOLN
100.0000 mL | Freq: Once | INTRAMUSCULAR | Status: AC | PRN
  Administered 2022-10-22: 50 mL via INTRA_ARTERIAL

## 2022-10-22 MED ORDER — SODIUM CHLORIDE 0.9% FLUSH: 3.0000 mL | INTRAVENOUS | Status: DC | PRN

## 2022-10-22 MED ORDER — LIDOCAINE HCL (PF) 1 % IJ SOLN
INTRAMUSCULAR | Status: AC
  Filled 2022-10-22: qty 30

## 2022-10-22 MED ORDER — ONDANSETRON HCL 4 MG PO TABS
4.0000 mg | ORAL_TABLET | Freq: Three times a day (TID) | ORAL | 0 refills | Status: AC | PRN
  Filled 2022-10-22: qty 20, 7d supply, fill #0

## 2022-10-22 MED ORDER — SODIUM CHLORIDE 0.9 % IV SOLN: INTRAVENOUS | Status: DC

## 2022-10-22 MED ORDER — ONDANSETRON HCL 4 MG/2ML IJ SOLN
INTRAMUSCULAR | Status: AC
  Filled 2022-10-22: qty 2

## 2022-10-22 MED ORDER — VERAPAMIL HCL 2.5 MG/ML IV SOLN
INTRAVENOUS | Status: AC
  Filled 2022-10-22: qty 2

## 2022-10-22 MED ORDER — DIPHENHYDRAMINE HCL 50 MG/ML IJ SOLN
INTRAMUSCULAR | Status: AC
  Filled 2022-10-22: qty 1

## 2022-10-22 MED ORDER — ONDANSETRON HCL 4 MG/2ML IJ SOLN
INTRAMUSCULAR | Status: AC | PRN
  Administered 2022-10-22: 4 mg via INTRAVENOUS

## 2022-10-22 MED ORDER — DEXAMETHASONE SODIUM PHOSPHATE 10 MG/ML IJ SOLN
10.0000 mg | Freq: Once | INTRAMUSCULAR | Status: AC
  Administered 2022-10-22: 10 mg via INTRAVENOUS
  Filled 2022-10-22: qty 1

## 2022-10-22 MED ORDER — MIDAZOLAM HCL 2 MG/2ML IJ SOLN
INTRAMUSCULAR | Status: AC
  Filled 2022-10-22: qty 6

## 2022-10-22 MED ORDER — HYDROMORPHONE HCL 1 MG/ML IJ SOLN
INTRAMUSCULAR | Status: AC | PRN
  Administered 2022-10-22: 1 mg via INTRAVENOUS

## 2022-10-22 MED ORDER — ONDANSETRON HCL 4 MG/2ML IJ SOLN: 4.0000 mg | Freq: Four times a day (QID) | INTRAMUSCULAR | Status: DC | PRN

## 2022-10-22 MED ORDER — DOCUSATE SODIUM 100 MG PO CAPS
100.0000 mg | ORAL_CAPSULE | Freq: Two times a day (BID) | ORAL | 0 refills | Status: AC
  Filled 2022-10-22: qty 100, 50d supply, fill #0

## 2022-10-22 MED ORDER — CEFAZOLIN IN SODIUM CHLORIDE 3-0.9 GM/100ML-% IV SOLN
3.0000 g | INTRAVENOUS | Status: AC
  Administered 2022-10-22: 3 g via INTRAVENOUS
  Filled 2022-10-22: qty 100

## 2022-10-22 MED ORDER — KETOROLAC TROMETHAMINE 30 MG/ML IJ SOLN
INTRAMUSCULAR | Status: AC | PRN
  Administered 2022-10-22 (×2): 30 mg via INTRAVENOUS

## 2022-10-22 MED ORDER — MIDAZOLAM HCL 2 MG/2ML IJ SOLN
INTRAMUSCULAR | Status: AC | PRN
  Administered 2022-10-22 (×2): 1 mg via INTRAVENOUS

## 2022-10-22 MED ORDER — GELATIN ABSORBABLE 12-7 MM EX MISC
CUTANEOUS | Status: AC
  Administered 2022-10-22: 2 via INTRA_ARTERIAL
  Filled 2022-10-22: qty 1

## 2022-10-22 MED ORDER — OXYCODONE HCL 5 MG PO TABS
10.0000 mg | ORAL_TABLET | Freq: Four times a day (QID) | ORAL | 0 refills | Status: AC | PRN
  Filled 2022-10-22: qty 30, 4d supply, fill #0

## 2022-10-22 MED ORDER — VERAPAMIL HCL 2.5 MG/ML IV SOLN: INTRA_ARTERIAL | Status: AC | PRN

## 2022-10-22 MED ORDER — LIDOCAINE HCL (PF) 1 % IJ SOLN
INTRAMUSCULAR | Status: AC | PRN
  Administered 2022-10-22: 5 mL
  Administered 2022-10-22: 2 mL
  Administered 2022-10-22: 5 mL

## 2022-10-22 MED ORDER — IOHEXOL 300 MG/ML  SOLN
100.0000 mL | Freq: Once | INTRAMUSCULAR | Status: AC | PRN
  Administered 2022-10-22: 15 mL via INTRA_ARTERIAL

## 2022-10-22 MED ORDER — HYDROMORPHONE HCL 1 MG/ML IJ SOLN
INTRAMUSCULAR | Status: AC
  Filled 2022-10-22: qty 1

## 2022-10-22 MED ORDER — OXYCODONE HCL 5 MG PO TABS
10.0000 mg | ORAL_TABLET | ORAL | Status: DC | PRN
  Administered 2022-10-22: 10 mg via ORAL
  Filled 2022-10-22: qty 2

## 2022-10-22 MED ORDER — HYDRALAZINE HCL 20 MG/ML IJ SOLN
INTRAMUSCULAR | Status: AC | PRN
  Administered 2022-10-22 (×2): 10 mg via INTRAVENOUS

## 2022-10-22 MED ORDER — SODIUM CHLORIDE 0.9 % IV SOLN: 250.0000 mL | INTRAVENOUS | Status: DC | PRN

## 2022-10-22 MED ORDER — NITROGLYCERIN IN D5W 100-5 MCG/ML-% IV SOLN
INTRAVENOUS | Status: AC
  Filled 2022-10-22: qty 250

## 2022-10-22 MED ORDER — ACETAMINOPHEN 10 MG/ML IV SOLN
1000.0000 mg | Freq: Once | INTRAVENOUS | Status: AC
  Administered 2022-10-22: 1000 mg via INTRAVENOUS
  Filled 2022-10-22: qty 100

## 2022-10-22 MED ORDER — HYDRALAZINE HCL 20 MG/ML IJ SOLN
INTRAMUSCULAR | Status: AC
  Filled 2022-10-22: qty 1

## 2022-10-22 MED ORDER — DIPHENHYDRAMINE HCL 50 MG/ML IJ SOLN
INTRAMUSCULAR | Status: AC | PRN
  Administered 2022-10-22: 25 mg via INTRAVENOUS

## 2022-10-22 MED ORDER — ONDANSETRON HCL 4 MG/2ML IJ SOLN
4.0000 mg | Freq: Once | INTRAMUSCULAR | Status: AC
  Administered 2022-10-22: 4 mg via INTRAVENOUS
  Filled 2022-10-22: qty 2

## 2022-10-22 NOTE — Discharge Instructions (Addendum)
Please call Interventional Radiology clinic [phone] with any questions or concerns.  You may remove your dressing and shower tomorrow.    Uterine Artery Embolization for Fibroids, Care After The following information offers guidance on how to care for yourself after your procedure. Your health care provider may also give you more specific instructions. If you have problems or questions, contact your health care provider. What can I expect after the procedure? After the procedure, it is common to have: Pain or discomfort at the incision site. Cramps in the area between your hips (pelvis). Vaginal bleeding. Vaginal discharge. Nausea and vomiting. Follow these instructions at home: Medicines Take over-the-counter and prescription medicines only as told by your health care provider. If you were prescribed an antibiotic medicine, take it as told by your health care provider. Do not stop using the antibiotic even if you start to feel better. If you are taking blood thinners: Talk with your health care provider before you take any medicines that contain aspirin or NSAIDs, such as ibuprofen. These medicines increase your risk for dangerous bleeding. Take your medicine exactly as told, at the same time every day. Avoid activities that could cause injury or bruising, and follow instructions about how to prevent falls. Wear a medical alert bracelet or carry a card that lists what medicines you take. Ask your health care provider if the medicine prescribed to you: Requires you to avoid driving or using machinery. Can cause constipation. You may need to take these actions to prevent or treat constipation: Drink enough fluid to keep your urine pale yellow. Take over-the-counter or prescription medicines. Eat foods that are high in fiber, such as beans, whole grains, and fresh fruits and vegetables. Limit foods that are high in fat and processed sugars, such as fried or sweet foods. Incision  care  Follow instructions from your health care provider about how to take care of your incision. Make sure you: Wash your hands with soap and water for at least 20 seconds before and after you change your bandage (dressing). If soap and water are not available, use hand sanitizer. Change your dressing as told by your health care provider. Leave stitches (sutures), skin glue, or adhesive strips in place. These skin closures may need to stay in place for 2 weeks or longer. If adhesive strip edges start to loosen and curl up, you may trim the loose edges. Do not remove adhesive strips completely unless your health care provider tells you to do that. Check your incision area every day for signs of infection. Check for: Redness, swelling, or pain. Fluid or blood. Warmth. Pus or a bad smell. Activity Do not lift anything that is heavier than 5 lb (2.3 kg), or the limit that you are told, until your health care provider says that it is safe. Return to your normal activities as told by your health care provider. Ask your health care provider what activities are safe for you. General instructions Do not use any products that contain nicotine or tobacco. These products include cigarettes, chewing tobacco, and vaping devices, such as e-cigarettes. These can delay incision healing. If you need help quitting, ask your health care provider. Do not take baths, swim, or use a hot tub until your health care provider approves. Ask your health care provider if you may take showers. You may only be allowed to take sponge baths. Do not have sex or put anything in your vagina until your health care provider says that this is safe. Do not  drink alcohol until your health care provider says it is okay. Wear compression stockings as told by your health care provider. These stockings help to prevent blood clots and reduce swelling in your legs. Keep all follow-up visits. This is important. Contact a health care provider  if: You have a fever. You have more redness, swelling, or pain around your incision. You have more fluid or blood coming from your incision site. Your incision feels warm to the touch. You have pus or a bad smell coming from your incision. You have a rash. You have nausea, or you cannot eat or drink anything without vomiting. You have a vaginal discharge that is not getting lighter. Get help right away if: You have trouble breathing. You have chest pain. You have severe pain in your abdomen, and it does not get better with medicine. You have leg pain or leg swelling. You feel dizzy, or you faint.  These symptoms may represent a serious problem that is an emergency. Do not wait to see if the symptoms will go away. Get medical help right away. Call your local emergency services (911 in the [place] Do not drive yourself to the hospital. S [place] After the procedure, it is common to have cramps, or pain or discomfort at the incision site. You will be given pain medicine. Follow instructions from your health care provider about how to take care of your incision. Check your incision area every day for signs of infection. Take over-the-counter and prescription medicines only as told by your health care provider. Contact your health care provider if you have symptoms of infection or other symptoms that do not get better with treatment. This information is not intended to replace advice given to you by your health care provider. Make sure you discuss any questions you have with your health care provider. Document Revised: 02/14/2020 Document Reviewed: 02/14/2020 Elsevier Patient Education  [place].   Moderate Conscious Sedation, Adult, Care After This sheet gives you information about how to care for yourself after your procedure. Your health care provider may also give you more specific instructions. If you have problems or questions, contact your health care provider. What can I  expect after the procedure? After the procedure, it is common to have: Sleepiness for several hours. Impaired judgment for several hours. Difficulty with balance. Vomiting if you eat too soon. Follow these instructions at home: For the time period you were told by your health care provider: Rest. Do not participate in activities where you could fall or become injured. Do not drive or use machinery. Do not drink alcohol. Do not take sleeping pills or medicines that cause drowsiness. Do not make important decisions or sign legal documents. Do not take care of children on your own.        Eating and drinking Follow the diet recommended by your health care provider. Drink enough fluid to keep your urine pale yellow. If you vomit: Drink water, juice, or soup when you can drink without vomiting. Make sure you have little or no nausea before eating solid foods.    General instructions Take over-the-counter and prescription medicines only as told by your health care provider. Have a responsible adult stay with you for the time you are told. It is important to have someone help care for you until you are awake and alert. Do not smoke. Keep all follow-up visits as told by your health care provider. This is important. Contact a health care provider if: You are  still sleepy or having trouble with balance after 24 hours. You feel light-headed. You keep feeling nauseous or you keep vomiting. You develop a rash. You have a fever. You have redness or swelling around the IV site. Get help right away if: You have trouble breathing. You have new-onset confusion at home. Summary After the procedure, it is common to feel sleepy, have impaired judgment, or feel nauseous if you eat too soon. Rest after you get home. Know the things you should not do after the procedure. Follow the diet recommended by your health care provider and drink enough fluid to keep your urine pale yellow. Get help right  away if you have trouble breathing or new-onset confusion at home. This information is not intended to replace advice given to you by your health care provider. Make sure you discuss any questions you have with your health care provider. Document Revised: 11/05/2019 Document Reviewed: 06/03/2019 Elsevier Patient Education  2021 [employer].

## 2022-10-22 NOTE — Progress Notes (Signed)
Patient ID: Karen Murray, female   DOB: 1986-06-09, 37 y.o.   MRN: [id number] Pt doing ok, s/p bilat uterine artery embolization this morning; has expected pelvic cramping, some nausea earlier; VSS afebrile; access site left radial artery without hematoma, sens/motor fxn ok, intact pulses; plan for dc home today; prescriptions for oxycodone, zofran, naprosyn, and colace sent electronically to pt's pharmacy; f/u phone call with Dr. Maryelizabeth Kaufmann in 1 month; f/u pelvic MRI in 6 months

## 2022-10-22 NOTE — Procedures (Signed)
Vascular and Interventional Radiology Procedure Note  Patient: Karen Murray DOB: Mar 31, 1986 Medical Record Number: [id number] Note Date/Time: 10/22/22 8:58 AM   Performing Physician: Michaelle Birks, MD Assistant(s): None  Diagnosis: Dysfunctional uterine bleeding   Procedure(s):  PELVIC ARTERIOGRAPHY BILATERAL UTERINE ARTERY EMBOLIZATION for FIBROIDS   Anesthesia: Conscious Sedation Complications: None Estimated Blood Loss: Minimal Specimens: None  Findings:  - access via the LEFT radial artery. - Bilateral enlarged and tortuous uterine arteries. RIGHT dominant. - Successful embolization with Gelfoam, 500-700um and 700-900um microparticles to stasis. - TR band closure at L wrist at the end of the case  Plan: - Post sheath removal precautions.  - Standard post radial access deflation protocol.   Final report to follow once all images are reviewed and compared with previous studies.  See detailed dictation with images in PACS. The patient tolerated the procedure well without incident or complication and was returned to Recovery in stable condition.    Michaelle Birks, MD [facility]   Pager. [place]

## 2022-10-23 ENCOUNTER — Telehealth (HOSPITAL_COMMUNITY): Payer: Self-pay | Admitting: Interventional Radiology

## 2022-10-23 NOTE — Progress Notes (Signed)
Vascular and Interventional Radiology  Phone Note  Patient: Karen Murray DOB: 24-Aug-1985 Medical Record Number: [id number] Note Date/Time: 10/23/22 3:23 PM   Diagnosis: dysfunctional bleeding from uterine fibroids   I identified myself to the patient and conveyed my credentials to Pt's mother, Ms.Karen Murray For medical emergencies, Pt was advised to call 911 or go to the nearest emergency room.   Assessment  Plan: 37 y.o. year old female POD 1 s/p UFE. VIR reached out in courtesy follow-up.  Pt's mother reports that Karen Murray is asleep and resting. She has been nauseous, managed with Rx anti-emetics. Pain is well controlled with Rxs.  Pt taking OTC stool softener as recommended.  No concern at this time.    Follow up Pt to follow up with me in Clinic within 1 month post op.  As part of this Telephone encounter, no in-person exam was conducted.  The patient was physically located in [place] or a state in which I am permitted to provide care. The encounter was reasonable and appropriate under the circumstances given the patient's presentation at the time.   Michaelle Birks, MD Vascular and Interventional Radiology Specialists [facility] Rad[facility]   Pager. [place]

## 2022-10-28 ENCOUNTER — Telehealth: Payer: Self-pay | Admitting: Radiology

## 2022-10-28 NOTE — Telephone Encounter (Signed)
Prescriptions for macrobid 100 mg po bid for 7 days,  diflucan 150 mg po, #1 and senna S,2 tabs daily,#20  were called into pt's pharmacy per order of Dr. Milford Cage

## 2022-11-08 ENCOUNTER — Ambulatory Visit (HOSPITAL_COMMUNITY): Payer: Medicaid Other

## 2022-11-08 ENCOUNTER — Other Ambulatory Visit (HOSPITAL_COMMUNITY): Payer: Medicaid Other

## 2022-11-22 ENCOUNTER — Ambulatory Visit
Admission: RE | Admit: 2022-11-22 | Discharge: 2022-11-22 | Disposition: A | Discharge status: Home / Self Care | Payer: Medicaid Other | Source: Ambulatory Visit | Attending: Radiology | Admitting: Radiology

## 2022-11-22 DIAGNOSIS — D259 Leiomyoma of uterus, unspecified: Secondary | ICD-10-CM

## 2022-11-22 HISTORY — PX: IR RADIOLOGIST EVAL & MGMT: IMG5224

## 2022-11-22 NOTE — Progress Notes (Signed)
Reason for visit: Hx of dysfunctional bleeding from uterine fibroids. Follow up s/p uterine artery embolization  Care Team(s): Primary Care: Patient, No Pcp Per GYN: Karen Shire, MD  Virtual Visit via Telephone Note   I connected with on [name] on 11/22/22 by telephone and verified that I am speaking with the correct person using two identifiers. I discussed the limitations, risks, security and privacy concerns of performing an evaluation and management service by telephone and the availability of in-person appointments.  History of present illness:  37 y/o F G0 without significant PMHx is referred for dysfunctional uterine bleeding. Pt with long standing uterine fibroids and heavy menstrual and peri-menstrual bleeding with ER visits documented as back as 05/2013 when she was 27. Pt reported that she was experiencing worsening peri-menstrual abdominal pain and spotting.   She underwent Uterine Fibroid Embolization (UFE) by me on 10/22/22 and was discharged same day as procedure. She endorsed initial post embolization discomfort which passed in a few days, and managed successfully by OTC medications. She denies post procedural concerns.    Her most recent period was "normal", noting that she only had to change her sanitary pad 2x/day. In comparison, her previous periods reportedly lasted at least 1.5 wks and required 3-4x/hr pad + tampon exchanges. She is already pleased with post procedural results.   Review of Systems: A 12-point ROS discussed, and pertinent positives are indicated in the HPI above.  All other systems are negative.   No past medical history on file.  Past Surgical History:  Procedure Laterality Date   IR ANGIOGRAM PELVIS SELECTIVE OR SUPRASELECTIVE  10/22/2022   IR ANGIOGRAM SELECTIVE EACH ADDITIONAL VESSEL  10/22/2022   IR ANGIOGRAM SELECTIVE EACH ADDITIONAL VESSEL  10/22/2022   IR ANGIOGRAM SELECTIVE EACH ADDITIONAL VESSEL  10/22/2022   IR EMBO  TUMOR ORGAN ISCHEMIA INFARCT INC GUIDE ROADMAPPING  10/22/2022   IR RADIOLOGIST EVAL & MGMT  08/30/2022   IR RADIOLOGIST EVAL & MGMT  11/22/2022   IR US GUIDE VASC ACCESS LEFT  10/22/2022    Allergies: Patient has no known allergies.  Medications: Prior to Admission medications   Medication Sig Start Date End Date Taking? Authorizing Provider  benzonatate (TESSALON) 100 MG capsule Take 1 capsule (100 mg total) by mouth every 8 (eight) hours. 07/16/20   Melene Plan, DO  docusate sodium (COLACE) 100 MG capsule Take 1 capsule (100 mg total) by mouth 2 (two) times daily. 10/22/22   Allred, Darrell K, PA-C  naproxen (NAPROSYN) 500 MG tablet Take 1 tablet (500 mg total) by mouth 2 (two) times daily with a meal. 10/22/22   Allred, Darrell K, PA-C  ondansetron (ZOFRAN ODT) 4 MG disintegrating tablet 4mg  ODT q4 hours prn nausea/vomit 07/16/20   Melene Plan, DO  ondansetron (ZOFRAN) 4 MG tablet Take 1 tablet (4 mg total) by mouth every 8 (eight) hours as needed for nausea or vomiting. 10/22/22   Allred, Darrell K, PA-C  oxyCODONE (ROXICODONE) 5 MG immediate release tablet Take 2 tablets (10 mg total) by mouth every 6 (six) hours as needed for severe pain. 10/22/22   Allred, Rosalita Levan, PA-C     Family History  Problem Relation Age of Onset   Cancer Maternal Grandmother        breast   Hypertension Neg Hx    Colon cancer Neg Hx    Diabetes Neg Hx     Social History   Socioeconomic History   Marital status: Single  Spouse name: Not on file   Number of children: Not on file   Years of education: Not on file   Highest education level: Not on file  Occupational History   Not on file  Tobacco Use   Smoking status: Every Day    Packs/day: 1    Types: Cigarettes   Smokeless tobacco: Not on file  Vaping Use   Vaping Use: Never used  Substance and Sexual Activity   Alcohol use: Yes    Comment: like once a week   Drug use: Yes    Types: Marijuana   Sexual activity: Yes    Birth control/protection: None   Other Topics Concern   Not on file  Social History Narrative   Not on file   Social Determinants of Health   Financial Resource Strain: Not on file  Food Insecurity: Not on file  Transportation Needs: Not on file  Physical Activity: Not on file  Stress: Not on file  Social Connections: Not on file     Vital Signs: There were no vitals taken for this visit.  Physical Exam Deferred secondary to virtual visit.  Imaging:  UFE  10/22/22. Demonstrating successful targeting and bilateral uterine artery embolization.     Labs:  CBC: Recent Labs    04/08/22 1200 10/22/22 0755  WBC 8.0 5.2  HGB 10.2* 8.4*  HCT 32.9* 28.2*  PLT 362 326    COAGS: Recent Labs    10/22/22 0755  INR 1.1    BMP: Recent Labs    04/08/22 1200 10/22/22 0755  NA 137 138  K 3.9 3.6  CL 108 105  CO2 25 24  GLUCOSE 63* 90  BUN 11 8  CALCIUM 8.5* 9.1  CREATININE 0.69 0.72  GFRNONAA >60 >60     Assessment and Plan:  Karen Murray Is a 37 y.o. F who presents with symptomatic uterine fibroids and dysfunctional menstrual bleeding.  Pt is s/p UFE on 10/22/22.   Pt endorses "normal" menses which are "100% better". She is pleased with post procedural results.   *No concern at this time. *Pt to follow up with her GYN on routine visit. *Return to [facility]IR Clinic PRN.     Thank you for allowing [place] to participate in the care of your Patient. Please contact me with questions, concerns, or if new issues arise.   Electronically Signed:  Roanna Banning, MD Vascular and Interventional Radiology Specialists [facility]   Pager. [phone] Clinic. [phone]   I spent a total of 25 Minutes of non-face-to-face time in clinical consultation, greater than 50% of which was counseling/coordinating care for Ms [facility] evaluation for dysfunctional uterine bleeding.

## 2023-07-07 ENCOUNTER — Other Ambulatory Visit: Payer: Self-pay

## 2023-07-07 ENCOUNTER — Emergency Department (HOSPITAL_BASED_OUTPATIENT_CLINIC_OR_DEPARTMENT_OTHER)
Admission: EM | Admit: 2023-07-07 | Discharge: 2023-07-07 | Disposition: A | Discharge status: Home / Self Care | Payer: Medicaid Other | Attending: Emergency Medicine | Admitting: Emergency Medicine

## 2023-07-07 ENCOUNTER — Encounter (HOSPITAL_BASED_OUTPATIENT_CLINIC_OR_DEPARTMENT_OTHER): Payer: Self-pay | Admitting: Emergency Medicine

## 2023-07-07 DIAGNOSIS — J101 Influenza due to other identified influenza virus with other respiratory manifestations: Secondary | ICD-10-CM | POA: Insufficient documentation

## 2023-07-07 DIAGNOSIS — Z20822 Contact with and (suspected) exposure to covid-19: Secondary | ICD-10-CM | POA: Diagnosis not present

## 2023-07-07 DIAGNOSIS — R059 Cough, unspecified: Secondary | ICD-10-CM | POA: Diagnosis present

## 2023-07-07 LAB — RESP PANEL BY RT-PCR (RSV, FLU A&B, COVID)  RVPGX2
Influenza A by PCR: POSITIVE — AB
Influenza B by PCR: NEGATIVE
Resp Syncytial Virus by PCR: NEGATIVE
SARS Coronavirus 2 by RT PCR: NEGATIVE

## 2023-07-07 LAB — GROUP A STREP BY PCR: Group A Strep by PCR: NOT DETECTED

## 2023-07-07 NOTE — Discharge Instructions (Signed)
Today you were seen for influenza A.  You may also take over-the-counter Flonase for congestion and do saline rinses as needed for postnasal drip.  You may alternate taking Tylenol and Motrin as needed for pain and fevers.  Please do not take Motrin for greater than 5 days in a row as this may cause rebound headaches.  Thank you for letting us treat you today. After performing a physical exam and reviewing your labs, I feel you are safe to go home. Please follow up with your PCP in the next several days and provide them with your records from this visit. Return to the Emergency Room if pain becomes severe or symptoms worsen.

## 2023-07-07 NOTE — ED Provider Notes (Signed)
EMERGENCY DEPARTMENT AT [facility] Provider Note   CSN: [id number] Arrival date & time: 07/07/23  1402     History  Chief Complaint  Patient presents with   Cough    Karen Murray is a 37 y.o. female presents today for 1 day of congestion, cough, chills, headache, and sore throat.  Patient denies fever, nausea, vomiting, shortness of breath, abdominal pain, chest pain, or diarrhea.  Patient is unsure of any sick contacts.   Cough Associated symptoms: chills, headaches and sore throat        Home Medications Prior to Admission medications   Medication Sig Start Date End Date Taking? Authorizing Provider  benzonatate (TESSALON) 100 MG capsule Take 1 capsule (100 mg total) by mouth every 8 (eight) hours. 07/16/20   Melene Plan, DO  docusate sodium (COLACE) 100 MG capsule Take 1 capsule (100 mg total) by mouth 2 (two) times daily. 10/22/22   Allred, Darrell K, PA-C  naproxen (NAPROSYN) 500 MG tablet Take 1 tablet (500 mg total) by mouth 2 (two) times daily with a meal. 10/22/22   Allred, Darrell K, PA-C  ondansetron (ZOFRAN ODT) 4 MG disintegrating tablet 4mg  ODT q4 hours prn nausea/vomit 07/16/20   Melene Plan, DO  ondansetron (ZOFRAN) 4 MG tablet Take 1 tablet (4 mg total) by mouth every 8 (eight) hours as needed for nausea or vomiting. 10/22/22   Allred, Darrell K, PA-C  oxyCODONE (ROXICODONE) 5 MG immediate release tablet Take 2 tablets (10 mg total) by mouth every 6 (six) hours as needed for severe pain. 10/22/22   Allred, Rosalita Levan, PA-C      Allergies    Patient has no known allergies.    Review of Systems   Review of Systems  Constitutional:  Positive for chills.  HENT:  Positive for congestion and sore throat.   Respiratory:  Positive for cough.   Neurological:  Positive for headaches.    Physical Exam Updated Vital Signs BP (!) 135/101 (BP Location: Left Arm)   Pulse (!) 116   Temp 98.9 F (37.2 C) (Oral)   Resp 20   Ht 5\' 4"  (1.626 m)   Wt  127 kg   LMP 07/02/2023 (Approximate)   SpO2 95%   BMI 48.06 kg/m  Physical Exam Vitals and nursing note reviewed.  Constitutional:      General: She is not in acute distress.    Appearance: She is well-developed. She is obese.  HENT:     Head: Normocephalic and atraumatic.     Right Ear: External ear normal.     Left Ear: External ear normal.     Nose: Congestion present.     Mouth/Throat:     Mouth: Mucous membranes are moist.     Pharynx: Uvula midline. Postnasal drip present. No oropharyngeal exudate or uvula swelling.     Tonsils: No tonsillar exudate or tonsillar abscesses.  Eyes:     General:        Right eye: No discharge.        Left eye: No discharge.     Extraocular Movements: Extraocular movements intact.     Conjunctiva/sclera: Conjunctivae normal.  Cardiovascular:     Rate and Rhythm: Normal rate and regular rhythm.     Pulses: Normal pulses.     Heart sounds: Normal heart sounds. No murmur heard. Pulmonary:     Effort: Pulmonary effort is normal. No respiratory distress.     Breath sounds: Normal breath sounds.  Abdominal:  Palpations: Abdomen is soft.     Tenderness: There is no abdominal tenderness.  Musculoskeletal:        General: No swelling.     Cervical back: Normal range of motion and neck supple.  Skin:    General: Skin is warm and dry.     Capillary Refill: Capillary refill takes less than 2 seconds.  Neurological:     General: No focal deficit present.     Mental Status: She is alert.  Psychiatric:        Mood and Affect: Mood normal.     ED Results / Procedures / Treatments   Labs (all labs ordered are listed, but only abnormal results are displayed) Labs Reviewed  RESP PANEL BY RT-PCR (RSV, FLU A&B, COVID)  [id number] - Abnormal; Notable for the following components:      Result Value   Influenza A by PCR POSITIVE (*)    All other components within normal limits  GROUP A STREP BY PCR    EKG None  Radiology No results  found.  Procedures Procedures    Medications Ordered in ED Medications - No data to display  ED Course/ Medical Decision Making/ A&P                                 Medical Decision Making  This patient presents to the ED with chief complaint(s) of upper respiratory symptoms with pertinent past medical history of none which further complicates the presenting complaint. The complaint involves an extensive differential diagnosis and also carries with it a high risk of complications and morbidity.    The differential diagnosis includes flu, COVID, RSV, upper respiratory infection  Additional history obtained: Records reviewed Care Everywhere/External Records  ED Course and Reassessment:   Independent labs interpretation:  The following labs were independently interpreted:  Respiratory panel: Flu A positive Strep PCR: Negative   Consultation: - Consulted or discussed management/test interpretation w/ external professional: None  Consideration for admission or further workup: Considered for admission or further workup however patient's vital signs, physical exam, and labs have all been reassuring.  Patient will be treated symptomatically outpatient for influenza A.        Final Clinical Impression(s) / ED Diagnoses Final diagnoses:  Influenza A    Rx / DC Orders ED Discharge Orders     None         Dolphus Jenny, PA-C 07/07/23 1533    Terrilee Files, MD 07/07/23 [phone]

## 2023-07-07 NOTE — ED Triage Notes (Signed)
Pt states she started having flulike symptoms yesterday including cough, HA, congestion, sore throat; denies fever, n/v, or ShoB
